# Patient Record
Sex: Female | Born: 1985 | Hispanic: No | Marital: Single | Smoking: Never smoker
Health system: Southern US, Community
[De-identification: ages and names within clinical notes are randomized; demographics above are authoritative.]

## PROBLEM LIST (undated history)

## (undated) DIAGNOSIS — G43909 Migraine, unspecified, not intractable, without status migrainosus: Secondary | ICD-10-CM

## (undated) HISTORY — DX: Migraine, unspecified, not intractable, without status migrainosus: G43.909

## (undated) HISTORY — PX: WISDOM TOOTH EXTRACTION: SHX21

## (undated) HISTORY — PX: COLPOSCOPY: SHX161

---

## 2008-03-26 ENCOUNTER — Other Ambulatory Visit: Admission: RE | Admit: 2008-03-26 | Discharge: 2008-03-26 | Payer: Self-pay | Admitting: Gynecology

## 2008-09-05 ENCOUNTER — Other Ambulatory Visit: Admission: RE | Admit: 2008-09-05 | Discharge: 2008-09-05 | Payer: Self-pay | Admitting: Gynecology

## 2011-12-24 LAB — OB RESULTS CONSOLE HIV ANTIBODY (ROUTINE TESTING): HIV: NONREACTIVE

## 2011-12-24 LAB — OB RESULTS CONSOLE GC/CHLAMYDIA
Chlamydia: NEGATIVE
Gonorrhea: NEGATIVE

## 2011-12-24 LAB — OB RESULTS CONSOLE ABO/RH: RH Type: POSITIVE

## 2011-12-24 LAB — OB RESULTS CONSOLE RUBELLA ANTIBODY, IGM: Rubella: UNDETERMINED

## 2011-12-24 LAB — OB RESULTS CONSOLE HEPATITIS B SURFACE ANTIGEN: Hepatitis B Surface Ag: NEGATIVE

## 2012-07-24 ENCOUNTER — Encounter (HOSPITAL_COMMUNITY): Payer: Self-pay | Admitting: Anesthesiology

## 2012-07-24 ENCOUNTER — Encounter (HOSPITAL_COMMUNITY): Payer: Self-pay | Admitting: *Deleted

## 2012-07-24 ENCOUNTER — Inpatient Hospital Stay (HOSPITAL_COMMUNITY)
Admission: AD | Admit: 2012-07-24 | Discharge: 2012-07-26 | DRG: 372 | Disposition: A | Discharge status: Home / Self Care | Payer: BC Managed Care – PPO | Source: Ambulatory Visit | Attending: Obstetrics & Gynecology | Admitting: Obstetrics & Gynecology

## 2012-07-24 ENCOUNTER — Inpatient Hospital Stay (HOSPITAL_COMMUNITY): Payer: BC Managed Care – PPO | Admitting: Anesthesiology

## 2012-07-24 DIAGNOSIS — O4290 Premature rupture of membranes, unspecified as to length of time between rupture and onset of labor, unspecified weeks of gestation: Principal | ICD-10-CM | POA: Diagnosis present

## 2012-07-24 LAB — CBC
MCHC: 35.2 g/dL (ref 30.0–36.0)
MCV: 88.2 fL (ref 78.0–100.0)
Platelets: 206 10*3/uL (ref 150–400)
RDW: 12.5 % (ref 11.5–15.5)
WBC: 13.8 10*3/uL — ABNORMAL HIGH (ref 4.0–10.5)

## 2012-07-24 MED ORDER — LIDOCAINE HCL (PF) 1 % IJ SOLN
INTRAMUSCULAR | Status: DC | PRN
  Administered 2012-07-24 (×4): 4 mL

## 2012-07-24 MED ORDER — EPHEDRINE 5 MG/ML INJ
10.0000 mg | INTRAVENOUS | Status: DC | PRN
  Filled 2012-07-24: qty 4

## 2012-07-24 MED ORDER — BENZOCAINE-MENTHOL 20-0.5 % EX AERO
1.0000 "application " | INHALATION_SPRAY | CUTANEOUS | Status: DC | PRN
  Administered 2012-07-24: 1 via TOPICAL
  Filled 2012-07-24: qty 56

## 2012-07-24 MED ORDER — PHENYLEPHRINE 40 MCG/ML (10ML) SYRINGE FOR IV PUSH (FOR BLOOD PRESSURE SUPPORT)
80.0000 ug | PREFILLED_SYRINGE | INTRAVENOUS | Status: DC | PRN
  Filled 2012-07-24: qty 5

## 2012-07-24 MED ORDER — IBUPROFEN 600 MG PO TABS
600.0000 mg | ORAL_TABLET | Freq: Four times a day (QID) | ORAL | Status: DC | PRN
  Administered 2012-07-24: 600 mg via ORAL
  Filled 2012-07-24: qty 1

## 2012-07-24 MED ORDER — SENNOSIDES-DOCUSATE SODIUM 8.6-50 MG PO TABS
2.0000 | ORAL_TABLET | Freq: Every day | ORAL | Status: DC
  Administered 2012-07-24 – 2012-07-25 (×2): 2 via ORAL

## 2012-07-24 MED ORDER — ONDANSETRON HCL 4 MG/2ML IJ SOLN: 4.0000 mg | Freq: Four times a day (QID) | INTRAMUSCULAR | Status: DC | PRN

## 2012-07-24 MED ORDER — EPHEDRINE 5 MG/ML INJ: 10.0000 mg | INTRAVENOUS | Status: DC | PRN

## 2012-07-24 MED ORDER — OXYTOCIN BOLUS FROM INFUSION: 500.0000 mL | INTRAVENOUS | Status: DC

## 2012-07-24 MED ORDER — LACTATED RINGERS IV SOLN
500.0000 mL | Freq: Once | INTRAVENOUS | Status: AC
  Administered 2012-07-24: 10:00:00 via INTRAVENOUS

## 2012-07-24 MED ORDER — SIMETHICONE 80 MG PO CHEW: 80.0000 mg | CHEWABLE_TABLET | ORAL | Status: DC | PRN

## 2012-07-24 MED ORDER — FLEET ENEMA 7-19 GM/118ML RE ENEM: 1.0000 | ENEMA | RECTAL | Status: DC | PRN

## 2012-07-24 MED ORDER — ACETAMINOPHEN 325 MG PO TABS: 650.0000 mg | ORAL_TABLET | ORAL | Status: DC | PRN

## 2012-07-24 MED ORDER — OXYTOCIN 40 UNITS IN LACTATED RINGERS INFUSION - SIMPLE MED
62.5000 mL/h | INTRAVENOUS | Status: DC
  Administered 2012-07-24: 62.5 mL/h via INTRAVENOUS
  Filled 2012-07-24: qty 1000

## 2012-07-24 MED ORDER — LIDOCAINE HCL (PF) 1 % IJ SOLN
30.0000 mL | INTRAMUSCULAR | Status: DC | PRN
  Filled 2012-07-24: qty 30

## 2012-07-24 MED ORDER — IBUPROFEN 600 MG PO TABS
600.0000 mg | ORAL_TABLET | Freq: Four times a day (QID) | ORAL | Status: DC
  Administered 2012-07-25 – 2012-07-26 (×7): 600 mg via ORAL
  Filled 2012-07-24 (×6): qty 1

## 2012-07-24 MED ORDER — DIBUCAINE 1 % RE OINT: 1.0000 "application " | TOPICAL_OINTMENT | RECTAL | Status: DC | PRN

## 2012-07-24 MED ORDER — FENTANYL 2.5 MCG/ML BUPIVACAINE 1/10 % EPIDURAL INFUSION (WH - ANES)
14.0000 mL/h | INTRAMUSCULAR | Status: DC
  Administered 2012-07-24: 14 mL/h via EPIDURAL
  Filled 2012-07-24: qty 125

## 2012-07-24 MED ORDER — DIPHENHYDRAMINE HCL 50 MG/ML IJ SOLN: 12.5000 mg | INTRAMUSCULAR | Status: DC | PRN

## 2012-07-24 MED ORDER — TETANUS-DIPHTH-ACELL PERTUSSIS 5-2.5-18.5 LF-MCG/0.5 IM SUSP: 0.5000 mL | Freq: Once | INTRAMUSCULAR | Status: DC

## 2012-07-24 MED ORDER — ZOLPIDEM TARTRATE 5 MG PO TABS: 5.0000 mg | ORAL_TABLET | Freq: Every evening | ORAL | Status: DC | PRN

## 2012-07-24 MED ORDER — ONDANSETRON HCL 4 MG/2ML IJ SOLN: 4.0000 mg | INTRAMUSCULAR | Status: DC | PRN

## 2012-07-24 MED ORDER — LANOLIN HYDROUS EX OINT: TOPICAL_OINTMENT | CUTANEOUS | Status: DC | PRN

## 2012-07-24 MED ORDER — TERBUTALINE SULFATE 1 MG/ML IJ SOLN: 0.2500 mg | Freq: Once | INTRAMUSCULAR | Status: DC | PRN

## 2012-07-24 MED ORDER — LACTATED RINGERS IV SOLN
INTRAVENOUS | Status: DC
  Administered 2012-07-24 (×2): via INTRAVENOUS

## 2012-07-24 MED ORDER — CITRIC ACID-SODIUM CITRATE 334-500 MG/5ML PO SOLN: 30.0000 mL | ORAL | Status: DC | PRN

## 2012-07-24 MED ORDER — PRENATAL MULTIVITAMIN CH
1.0000 | ORAL_TABLET | Freq: Every day | ORAL | Status: DC
  Administered 2012-07-24 – 2012-07-26 (×3): 1 via ORAL
  Filled 2012-07-24 (×3): qty 1

## 2012-07-24 MED ORDER — OXYCODONE-ACETAMINOPHEN 5-325 MG PO TABS: 1.0000 | ORAL_TABLET | ORAL | Status: DC | PRN

## 2012-07-24 MED ORDER — ONDANSETRON HCL 4 MG PO TABS: 4.0000 mg | ORAL_TABLET | ORAL | Status: DC | PRN

## 2012-07-24 MED ORDER — DIPHENHYDRAMINE HCL 25 MG PO CAPS: 25.0000 mg | ORAL_CAPSULE | Freq: Four times a day (QID) | ORAL | Status: DC | PRN

## 2012-07-24 MED ORDER — WITCH HAZEL-GLYCERIN EX PADS: 1.0000 "application " | MEDICATED_PAD | CUTANEOUS | Status: DC | PRN

## 2012-07-24 MED ORDER — OXYTOCIN 40 UNITS IN LACTATED RINGERS INFUSION - SIMPLE MED
1.0000 m[IU]/min | INTRAVENOUS | Status: DC
  Administered 2012-07-24: 2 m[IU]/min via INTRAVENOUS

## 2012-07-24 MED ORDER — LACTATED RINGERS IV SOLN
500.0000 mL | INTRAVENOUS | Status: DC | PRN
  Administered 2012-07-24 (×2): 500 mL via INTRAVENOUS

## 2012-07-24 MED ORDER — PHENYLEPHRINE 40 MCG/ML (10ML) SYRINGE FOR IV PUSH (FOR BLOOD PRESSURE SUPPORT): 80.0000 ug | PREFILLED_SYRINGE | INTRAVENOUS | Status: DC | PRN

## 2012-07-24 NOTE — H&P (Signed)
Alexandra Anderson is a 26 y.o. female presenting for PROM at 0215; rare mild CTX followed.  +FM.  No VB.   Maternal Medical History:  Reason for admission: Reason for admission: rupture of membranes.  Contractions: Onset was 3-5 hours ago.   Frequency: rare.   Perceived severity is mild.    Fetal activity: Perceived fetal activity is normal.   Last perceived fetal movement was within the past hour.    Prenatal complications: no prenatal complications Prenatal Complications - Diabetes: none.    OB History    Grav Para Term Preterm Abortions TAB SAB Ect Mult Living   1              No past medical history on file. Past Surgical History  Procedure Date  . Colposcopy    Family History: family history is negative for Other. Social History:  reports that she has never smoked. She does not have any smokeless tobacco history on file. She reports that she does not drink alcohol or use illicit drugs.   Prenatal Transfer Tool  Maternal Diabetes: No Genetic Screening: Normal Maternal Ultrasounds/Referrals: Normal Fetal Ultrasounds or other Referrals:  None Maternal Substance Abuse:  No Significant Maternal Medications:  None Significant Maternal Lab Results:  None Other Comments:  None  ROS  Dilation: 3 Effacement (%): 100 Station: -2 Exam by:: B Mosca Blood pressure 135/80, pulse 102, temperature 97.9 F (36.6 C), temperature source Oral, resp. rate 18, height 5\' 4"  (1.626 m), last menstrual period 10/16/2011. Maternal Exam:  Uterine Assessment: Contraction strength is mild.  Contraction frequency is rare.   Abdomen: Patient reports no abdominal tenderness. Fundal height is c/w dates.   Estimated fetal weight is 7#8.   Fetal presentation: vertex  Introitus: Normal vulva. Normal vagina.  Ferning test: positive.  Amniotic fluid character: clear.  Pelvis: adequate for delivery.   Cervix: Cervix evaluated by digital exam.     Physical Exam  Constitutional: She is oriented to  person, place, and time. She appears well-developed and well-nourished.  HENT:  Head: Normocephalic and atraumatic.  Neck: Normal range of motion. Neck supple.  GI: Soft. Bowel sounds are normal.  Neurological: She is alert and oriented to person, place, and time.  Skin: Skin is warm and dry.  Psychiatric: She has a normal mood and affect. Her behavior is normal.    Prenatal labs: ABO, Rh: B/Positive/-- (05/02 0000) Antibody: Negative (05/02 0000) Rubella: Equivocal (05/02 0000) RPR: Nonreactive (05/02 0000)  HBsAg: Negative (05/02 0000)  HIV: Non-reactive (05/02 0000)  GBS: Negative (10/30 0000)   Assessment/Plan: 26yo G1 at [redacted]w[redacted]d with PROM -Will augment with pitocin -GBS neg -Epidural when desired   Rosette Bellavance 07/24/2012, 7:13 AM

## 2012-07-24 NOTE — Anesthesia Procedure Notes (Signed)
Epidural Patient location during procedure: OB Start time: 07/24/2012 10:30 AM  Staffing Performed by: anesthesiologist   Preanesthetic Checklist Completed: patient identified, site marked, surgical consent, pre-op evaluation, timeout performed, IV checked, risks and benefits discussed and monitors and equipment checked  Epidural Patient position: sitting Prep: site prepped and draped and DuraPrep Patient monitoring: continuous pulse ox and blood pressure Approach: midline Injection technique: LOR air  Needle:  Needle type: Tuohy  Needle gauge: 17 G Needle length: 9 cm and 9 Needle insertion depth: 6 cm Catheter type: closed end flexible Catheter size: 19 Gauge Catheter at skin depth: 11 cm Test dose: negative  Assessment Events: blood not aspirated, injection not painful, no injection resistance, negative IV test and no paresthesia  Additional Notes Discussed risk of headache, infection, bleeding, nerve injury and failed or incomplete block.  Patient voices understanding and wishes to proceed. Reason for block:procedure for pain

## 2012-07-24 NOTE — Anesthesia Preprocedure Evaluation (Signed)

## 2012-07-24 NOTE — MAU Note (Signed)
Pt reports LOF  @ 0215. Pt also reports cramping.

## 2012-07-24 NOTE — Op Note (Signed)
SVD of VFI without immediate complication; wt and cord pH pending; APGARs 8, 9.  EBL 300cc. Head delivered LOA with atraumatic delivery of the body through a tight body cord.  Mouth and nose bulb suctioned.  Cord clamped, cut and baby to abdomen.  Cord pH obtained.  Placenta delivered S/I/3VC and sent to L&D.  Fundus firmed with pitocin and massage.  Bilateral periurethral lacs hemostatic without repair.  Mom and baby stable.    Mitchel Honour, DO

## 2012-07-24 NOTE — Progress Notes (Signed)
No complaints.  Comfortable with epidural. VSS.  Toco: unable to trace; palpate q 2-4 minutes SVE: 5/c/-2, AROM forebag with clear fluid; IUPC placed FHT: 130. Mod var. No accel. No decel. 26yo G1 at [redacted]w[redacted]d for augmentation -Will watch MVUs and adjust pitocin appropriately -Anticipate NSVD  Mitchel Honour, DO

## 2012-07-25 LAB — CBC
Platelets: 162 10*3/uL (ref 150–400)
RBC: 3.58 MIL/uL — ABNORMAL LOW (ref 3.87–5.11)
RDW: 12.7 % (ref 11.5–15.5)
WBC: 13.7 10*3/uL — ABNORMAL HIGH (ref 4.0–10.5)

## 2012-07-25 NOTE — Progress Notes (Cosign Needed)
Post Partum Day 1 Subjective: no complaints, up ad lib, voiding and tolerating PO  Objective: Blood pressure 120/71, pulse 85, temperature 97.8 F (36.6 C), temperature source Oral, resp. rate 20, height 5\' 4"  (1.626 m), weight 86.183 kg (190 lb), last menstrual period 10/16/2011, SpO2 100.00%, unknown if currently breastfeeding.  Physical Exam:  General: alert and cooperative Lochia: appropriate Uterine Fundus: firm Incision: perineum intact DVT Evaluation: No evidence of DVT seen on physical exam. Negative Homan's sign. No cords or calf tenderness. No significant calf/ankle edema.   Basename 07/25/12 0507 07/24/12 0715  HGB 11.0* 12.9  HCT 31.9* 36.6    Assessment/Plan: Plan for discharge tomorrow   LOS: 1 day   Alexandra Anderson 07/25/2012, 8:11 AM

## 2012-07-25 NOTE — Anesthesia Postprocedure Evaluation (Signed)
Anesthesia Post Note  Patient: Alexandra Anderson  Procedure(s) Performed: * No procedures listed *  Anesthesia type: Epidural  Patient location: Mother/Baby  Post pain: Pain level controlled  Post assessment: Post-op Vital signs reviewed  Last Vitals:  Filed Vitals:   07/25/12 0655  BP: 120/71  Pulse: 85  Temp: 36.6 C  Resp: 20    Post vital signs: Reviewed  Level of consciousness:alert  Complications: No apparent anesthesia complications

## 2012-07-26 MED ORDER — IBUPROFEN 600 MG PO TABS: 600.0000 mg | ORAL_TABLET | Freq: Four times a day (QID) | ORAL | Status: DC

## 2012-07-26 NOTE — Discharge Summary (Signed)
Obstetric Discharge Summary Reason for Admission: rupture of membranes Prenatal Procedures: ultrasound Intrapartum Procedures: spontaneous vaginal delivery Postpartum Procedures: none Complications-Operative and Postpartum: none Hemoglobin  Date Value Range Status  07/25/2012 11.0* 12.0 - 15.0 g/dL Final     HCT  Date Value Range Status  07/25/2012 31.9* 36.0 - 46.0 % Final    Physical Exam:  General: alert and cooperative Lochia: appropriate Uterine Fundus: firm Incision: perineum intact DVT Evaluation: No evidence of DVT seen on physical exam. No significant calf/ankle edema.  Discharge Diagnoses: Term Pregnancy-delivered  Discharge Information: Date: 07/26/2012 Activity: pelvic rest Diet: routine Medications: PNV and Ibuprofen Condition: stable Instructions: refer to practice specific booklet Discharge to: home   Newborn Data: Live born female  Birth Weight: 8 lb 6.2 oz (3805 g) APGAR: 8, 9  Home with mother.  Lainy Wrobleski G 07/26/2012, 8:00 AM

## 2012-07-27 ENCOUNTER — Inpatient Hospital Stay (HOSPITAL_COMMUNITY): Admission: RE | Admit: 2012-07-27 | Payer: 59 | Source: Ambulatory Visit

## 2012-09-15 ENCOUNTER — Emergency Department (HOSPITAL_COMMUNITY): Payer: BC Managed Care – PPO

## 2012-09-15 ENCOUNTER — Emergency Department (HOSPITAL_COMMUNITY)
Admission: EM | Admit: 2012-09-15 | Discharge: 2012-09-15 | Disposition: A | Discharge status: Home / Self Care | Payer: BC Managed Care – PPO | Attending: Emergency Medicine | Admitting: Emergency Medicine

## 2012-09-15 ENCOUNTER — Encounter (HOSPITAL_COMMUNITY): Payer: Self-pay | Admitting: Emergency Medicine

## 2012-09-15 DIAGNOSIS — S52509A Unspecified fracture of the lower end of unspecified radius, initial encounter for closed fracture: Secondary | ICD-10-CM | POA: Insufficient documentation

## 2012-09-15 DIAGNOSIS — W010XXA Fall on same level from slipping, tripping and stumbling without subsequent striking against object, initial encounter: Secondary | ICD-10-CM | POA: Insufficient documentation

## 2012-09-15 DIAGNOSIS — S52609A Unspecified fracture of lower end of unspecified ulna, initial encounter for closed fracture: Secondary | ICD-10-CM | POA: Insufficient documentation

## 2012-09-15 DIAGNOSIS — R42 Dizziness and giddiness: Secondary | ICD-10-CM | POA: Insufficient documentation

## 2012-09-15 DIAGNOSIS — R5381 Other malaise: Secondary | ICD-10-CM | POA: Insufficient documentation

## 2012-09-15 DIAGNOSIS — Y929 Unspecified place or not applicable: Secondary | ICD-10-CM | POA: Insufficient documentation

## 2012-09-15 DIAGNOSIS — R209 Unspecified disturbances of skin sensation: Secondary | ICD-10-CM | POA: Insufficient documentation

## 2012-09-15 DIAGNOSIS — W108XXA Fall (on) (from) other stairs and steps, initial encounter: Secondary | ICD-10-CM | POA: Insufficient documentation

## 2012-09-15 DIAGNOSIS — H53489 Generalized contraction of visual field, unspecified eye: Secondary | ICD-10-CM | POA: Insufficient documentation

## 2012-09-15 DIAGNOSIS — S0993XA Unspecified injury of face, initial encounter: Secondary | ICD-10-CM | POA: Insufficient documentation

## 2012-09-15 DIAGNOSIS — R002 Palpitations: Secondary | ICD-10-CM | POA: Insufficient documentation

## 2012-09-15 DIAGNOSIS — Y939 Activity, unspecified: Secondary | ICD-10-CM | POA: Insufficient documentation

## 2012-09-15 MED ORDER — OXYCODONE-ACETAMINOPHEN 5-325 MG PO TABS
1.0000 | ORAL_TABLET | Freq: Once | ORAL | Status: AC
  Administered 2012-09-15: 1 via ORAL
  Filled 2012-09-15: qty 1

## 2012-09-15 MED ORDER — OXYCODONE-ACETAMINOPHEN 5-325 MG PO TABS: 1.0000 | ORAL_TABLET | Freq: Four times a day (QID) | ORAL | Status: DC | PRN

## 2012-09-15 NOTE — ED Notes (Signed)
Pt complains of "I fell this morning on my arm" Pt complains of pain to right wrist and forearm at this time. Pt does report positive LOC and hitting head when she fell. Pt alert and oriented at this time.

## 2012-09-15 NOTE — ED Provider Notes (Signed)
History     CSN: [id number]  Arrival date & time 09/15/12  1009   First MD Initiated Contact with Patient 09/15/12 1022      Chief Complaint  Patient presents with  . Arm Pain    (Consider location/radiation/quality/duration/timing/severity/associated sxs/prior treatment) Patient is a 27 y.o. female presenting with arm pain.  Arm Pain   Patient presents to the emergency department complaining of right forearm and wrist pain after a fall on an outstretched hand. She was walking her dog and tripped over a stair and landed with all her weight on her right hand. She has swelling, tenderness to palpation, pain with movement. No paresthesias, no numbness.  Patient admits to hitting her head on the pavement, but she is unsure where on her head the contact was. Patient states that once she reached her second floor apartment, she began experiencing tunnel vision and lightheadedness. She was able to ambulate to the couch where she briefly syncopized for less than one minute. She has no remaining lightheadedness, dizziness, weakness, headache, or scalp pain.   History reviewed. No pertinent past medical history.  Past Surgical History  Procedure Date  . Colposcopy     Family History  Problem Relation Age of Onset  . Other Neg Hx   . Heart disease Father     History  Substance Use Topics  . Smoking status: Never Smoker   . Smokeless tobacco: Not on file  . Alcohol Use: No    OB History    Grav Para Term Preterm Abortions TAB SAB Ect Mult Living   1 1 1  0 0 0 0 0 0 1      Review of Systems All other systems negative except as documented in the HPI. All pertinent positives and negatives as reviewed in the HPI.   Allergies  Sulfa antibiotics  Home Medications  No current outpatient prescriptions on file.  BP 108/58  Pulse 62  Temp 98.5 F (36.9 C) (Oral)  Resp 18  SpO2 100%  Breastfeeding? No  Physical Exam  Constitutional: She is oriented to person, place, and  time. She appears well-developed and well-nourished.  HENT:  Head: Normocephalic and atraumatic.  Cardiovascular: Normal rate.   Pulmonary/Chest: Effort normal.  Musculoskeletal:       Right wrist: She exhibits decreased range of motion, tenderness, bony tenderness and swelling.       Right forearm: She exhibits tenderness, bony tenderness and swelling. She exhibits no laceration.       Arms: Neurological: She is alert and oriented to person, place, and time. No cranial nerve deficit or sensory deficit. GCS eye subscore is 4. GCS verbal subscore is 5. GCS motor subscore is 6.       No sensory deficits. Able to move all five fingers on right hand, but unable to flex or extend wrist due to pain.   Skin: Skin is warm and dry. Abrasion noted. No laceration and no rash noted. There is erythema. No pallor.     Psychiatric: She has a normal mood and affect. Her behavior is normal. Judgment and thought content normal.    ED Course  Procedures (including critical care time)  Labs Reviewed - No data to display Dg Forearm Right  09/15/2012  *RADIOLOGY REPORT*  Clinical Data: Right wrist and forearm pain post fall  RIGHT FOREARM - 2 VIEW  Comparison: None  Findings: Metaphyseal fracture distal right radius with minimal dorsal displacement and apex volar angulation. Minimally displaced ulnar styloid fracture. No additional  fracture, dislocation or bone destruction.  IMPRESSION: Ulnar styloid fracture. Transverse displaced and angulated distal right radial metaphyseal fracture.   Original Report Authenticated By: Ulyses Southward, M.D.    Dg Wrist Complete Right  09/15/2012  *RADIOLOGY REPORT*  Clinical Data: Fall today landing on wrist, pain right wrist and forearm  RIGHT WRIST - COMPLETE 3+ VIEW  Comparison: None.  Findings: Displaced ulnar styloid fracture. Transverse comminuted metaphyseal fracture distal right radius with mild dorsal displacement and apex volar angulation. No definite intra-articular  extension. Joint spaces preserved. No additional fracture or dislocation identified.  IMPRESSION: Displaced ulnar styloid fracture. Comminuted displaced and angulated distal right radial metaphyseal fracture as above.   Original Report Authenticated By: Ulyses Southward, M.D.      Spoke with Dr. Merlyn Lot about the patient and he will see the patient in the office. Patient voices an understanding.  MDM  MDM Reviewed: vitals and nursing note Interpretation: x-ray             Carlyle Dolly, PA-C 09/16/12 1529

## 2012-09-16 ENCOUNTER — Other Ambulatory Visit: Payer: Self-pay | Admitting: Orthopedic Surgery

## 2012-09-16 ENCOUNTER — Encounter (HOSPITAL_BASED_OUTPATIENT_CLINIC_OR_DEPARTMENT_OTHER): Payer: Self-pay | Admitting: *Deleted

## 2012-09-17 NOTE — ED Provider Notes (Signed)
Medical screening examination/treatment/procedure(s) were performed by non-physician practitioner and as supervising physician I was immediately available for consultation/collaboration.   Winnona Wargo, MD 09/17/12 1419 

## 2012-09-19 ENCOUNTER — Ambulatory Visit (HOSPITAL_BASED_OUTPATIENT_CLINIC_OR_DEPARTMENT_OTHER)
Admission: RE | Admit: 2012-09-19 | Discharge: 2012-09-19 | Disposition: A | Discharge status: Home / Self Care | Payer: BC Managed Care – PPO | Source: Ambulatory Visit | Attending: Orthopedic Surgery | Admitting: Orthopedic Surgery

## 2012-09-19 ENCOUNTER — Ambulatory Visit (HOSPITAL_BASED_OUTPATIENT_CLINIC_OR_DEPARTMENT_OTHER): Payer: BC Managed Care – PPO | Admitting: Anesthesiology

## 2012-09-19 ENCOUNTER — Encounter (HOSPITAL_BASED_OUTPATIENT_CLINIC_OR_DEPARTMENT_OTHER): Payer: Self-pay | Admitting: Orthopedic Surgery

## 2012-09-19 ENCOUNTER — Encounter (HOSPITAL_BASED_OUTPATIENT_CLINIC_OR_DEPARTMENT_OTHER): Payer: Self-pay | Admitting: *Deleted

## 2012-09-19 ENCOUNTER — Encounter (HOSPITAL_BASED_OUTPATIENT_CLINIC_OR_DEPARTMENT_OTHER): Payer: Self-pay | Admitting: Anesthesiology

## 2012-09-19 ENCOUNTER — Encounter (HOSPITAL_BASED_OUTPATIENT_CLINIC_OR_DEPARTMENT_OTHER)
Admission: RE | Disposition: A | Discharge status: Home / Self Care | Payer: Self-pay | Source: Ambulatory Visit | Attending: Orthopedic Surgery

## 2012-09-19 DIAGNOSIS — Z882 Allergy status to sulfonamides status: Secondary | ICD-10-CM | POA: Insufficient documentation

## 2012-09-19 DIAGNOSIS — S52599A Other fractures of lower end of unspecified radius, initial encounter for closed fracture: Secondary | ICD-10-CM | POA: Insufficient documentation

## 2012-09-19 DIAGNOSIS — W19XXXA Unspecified fall, initial encounter: Secondary | ICD-10-CM | POA: Insufficient documentation

## 2012-09-19 HISTORY — PX: OPEN REDUCTION INTERNAL FIXATION (ORIF) DISTAL RADIAL FRACTURE: SHX5989

## 2012-09-19 SURGERY — OPEN REDUCTION INTERNAL FIXATION (ORIF) DISTAL RADIUS FRACTURE
Anesthesia: Choice | Site: Wrist | Laterality: Right | Wound class: Clean

## 2012-09-19 MED ORDER — PROPOFOL 10 MG/ML IV BOLUS
INTRAVENOUS | Status: DC | PRN
  Administered 2012-09-19: 150 mg via INTRAVENOUS

## 2012-09-19 MED ORDER — 0.9 % SODIUM CHLORIDE (POUR BTL) OPTIME
TOPICAL | Status: DC | PRN
  Administered 2012-09-19: 300 mL

## 2012-09-19 MED ORDER — LACTATED RINGERS IV SOLN
INTRAVENOUS | Status: DC
  Administered 2012-09-19: 12:00:00 via INTRAVENOUS

## 2012-09-19 MED ORDER — BUPIVACAINE-EPINEPHRINE PF 0.5-1:200000 % IJ SOLN
INTRAMUSCULAR | Status: DC | PRN
  Administered 2012-09-19: 30 mL

## 2012-09-19 MED ORDER — ONDANSETRON HCL 4 MG/2ML IJ SOLN
INTRAMUSCULAR | Status: DC | PRN
  Administered 2012-09-19: 4 mg via INTRAVENOUS

## 2012-09-19 MED ORDER — OXYCODONE HCL 5 MG PO TABS: 5.0000 mg | ORAL_TABLET | Freq: Once | ORAL | Status: DC | PRN

## 2012-09-19 MED ORDER — MIDAZOLAM HCL 2 MG/2ML IJ SOLN
1.0000 mg | INTRAMUSCULAR | Status: DC | PRN
  Administered 2012-09-19: 2 mg via INTRAVENOUS

## 2012-09-19 MED ORDER — LIDOCAINE HCL (CARDIAC) 20 MG/ML IV SOLN
INTRAVENOUS | Status: DC | PRN
  Administered 2012-09-19: 40 mg via INTRAVENOUS

## 2012-09-19 MED ORDER — ROPIVACAINE HCL 5 MG/ML IJ SOLN
INTRAMUSCULAR | Status: DC | PRN
  Administered 2012-09-19: 5 mL via EPIDURAL

## 2012-09-19 MED ORDER — FENTANYL CITRATE 0.05 MG/ML IJ SOLN
50.0000 ug | INTRAMUSCULAR | Status: DC | PRN
  Administered 2012-09-19: 100 ug via INTRAVENOUS

## 2012-09-19 MED ORDER — HYDROMORPHONE HCL PF 1 MG/ML IJ SOLN: 0.2500 mg | INTRAMUSCULAR | Status: DC | PRN

## 2012-09-19 MED ORDER — OXYCODONE HCL 5 MG/5ML PO SOLN: 5.0000 mg | Freq: Once | ORAL | Status: DC | PRN

## 2012-09-19 MED ORDER — DEXAMETHASONE SODIUM PHOSPHATE 4 MG/ML IJ SOLN
INTRAMUSCULAR | Status: DC | PRN
  Administered 2012-09-19: 10 mg via INTRAVENOUS

## 2012-09-19 MED ORDER — CEFAZOLIN SODIUM-DEXTROSE 2-3 GM-% IV SOLR
2.0000 g | Freq: Once | INTRAVENOUS | Status: AC
  Administered 2012-09-19: 2 g via INTRAVENOUS

## 2012-09-19 MED ORDER — HYDROCODONE-ACETAMINOPHEN 5-325 MG PO TABS: ORAL_TABLET | ORAL | Status: DC

## 2012-09-19 MED ORDER — CHLORHEXIDINE GLUCONATE 4 % EX LIQD: 60.0000 mL | Freq: Once | CUTANEOUS | Status: DC

## 2012-09-19 SURGICAL SUPPLY — 73 items
BANDAGE CONFORM 3  STR LF (GAUZE/BANDAGES/DRESSINGS) IMPLANT
BANDAGE ELASTIC 3 VELCRO ST LF (GAUZE/BANDAGES/DRESSINGS) IMPLANT
BANDAGE GAUZE ELAST BULKY 4 IN (GAUZE/BANDAGES/DRESSINGS) ×2 IMPLANT
BIT DRILL 2.0 LNG QUCK RELEASE (BIT) ×1 IMPLANT
BIT DRILL 2.8X5 QR DISP (BIT) ×2 IMPLANT
BLADE MINI RND TIP GREEN BEAV (BLADE) IMPLANT
BLADE SURG 15 STRL LF DISP TIS (BLADE) ×2 IMPLANT
BLADE SURG 15 STRL SS (BLADE) ×2
BNDG ELASTIC 2 VLCR STRL LF (GAUZE/BANDAGES/DRESSINGS) IMPLANT
BNDG ESMARK 4X9 LF (GAUZE/BANDAGES/DRESSINGS) ×2 IMPLANT
CHLORAPREP W/TINT 26ML (MISCELLANEOUS) ×2 IMPLANT
CLOTH BEACON ORANGE TIMEOUT ST (SAFETY) ×2 IMPLANT
CORDS BIPOLAR (ELECTRODE) ×2 IMPLANT
COVER MAYO STAND STRL (DRAPES) ×2 IMPLANT
COVER TABLE BACK 60X90 (DRAPES) ×2 IMPLANT
DRAPE EXTREMITY T 121X128X90 (DRAPE) ×2 IMPLANT
DRAPE OEC MINIVIEW 54X84 (DRAPES) ×2 IMPLANT
DRAPE SURG 17X23 STRL (DRAPES) ×2 IMPLANT
DRAPE UTILITY W/TAPE 26X15 (DRAPES) ×2 IMPLANT
DRILL 2.0 LNG QUICK RELEASE (BIT) ×2
GAUZE XEROFORM 1X8 LF (GAUZE/BANDAGES/DRESSINGS) ×2 IMPLANT
GLOVE BIO SURGEON STRL SZ7.5 (GLOVE) ×2 IMPLANT
GLOVE BIOGEL M STRL SZ7.5 (GLOVE) ×2 IMPLANT
GLOVE BIOGEL PI IND STRL 8 (GLOVE) ×2 IMPLANT
GLOVE BIOGEL PI IND STRL 8.5 (GLOVE) IMPLANT
GLOVE BIOGEL PI INDICATOR 8 (GLOVE) ×2
GLOVE BIOGEL PI INDICATOR 8.5 (GLOVE)
GLOVE SKINSENSE NS SZ7.0 (GLOVE) ×1
GLOVE SKINSENSE STRL SZ7.0 (GLOVE) ×1 IMPLANT
GLOVE SURG ORTHO 8.0 STRL STRW (GLOVE) IMPLANT
GOWN PREVENTION PLUS XLARGE (GOWN DISPOSABLE) ×4 IMPLANT
GOWN STRL REIN XL XLG (GOWN DISPOSABLE) ×2 IMPLANT
GUIDEWIRE ORTHO 0.054X6 (WIRE) ×6 IMPLANT
NEEDLE HYPO 22GX1.5 SAFETY (NEEDLE) IMPLANT
NEEDLE HYPO 25X1 1.5 SAFETY (NEEDLE) IMPLANT
NS IRRIG 1000ML POUR BTL (IV SOLUTION) ×2 IMPLANT
PACK BASIN DAY SURGERY FS (CUSTOM PROCEDURE TRAY) ×2 IMPLANT
PAD CAST 3X4 CTTN HI CHSV (CAST SUPPLIES) ×1 IMPLANT
PAD CAST 4YDX4 CTTN HI CHSV (CAST SUPPLIES) IMPLANT
PADDING CAST ABS 4INX4YD NS (CAST SUPPLIES) ×1
PADDING CAST ABS COTTON 4X4 ST (CAST SUPPLIES) ×1 IMPLANT
PADDING CAST COTTON 3X4 STRL (CAST SUPPLIES) ×1
PADDING CAST COTTON 4X4 STRL (CAST SUPPLIES)
PLATE ACULOCK 2 NARROW RT (Plate) ×2 IMPLANT
SCREW 3.5MMX10.0MM (Screw) ×2 IMPLANT
SCREW 3.5MMX12.0MM (Screw) ×2 IMPLANT
SCREW CORT 3.5X14 (Screw) ×2 IMPLANT
SCREW CORT FT 20X2.3XLCK HEX (Screw) ×1 IMPLANT
SCREW CORT FT 22X2.3XLCK HEX (Screw) ×1 IMPLANT
SCREW CORTICAL LOCKING 2.3X18M (Screw) ×2 IMPLANT
SCREW CORTICAL LOCKING 2.3X20M (Screw) ×3 IMPLANT
SCREW CORTICAL LOCKING 2.3X22M (Screw) ×1 IMPLANT
SCREW FX18X2.3XSMTH LCK NS CRT (Screw) ×2 IMPLANT
SCREW FX20X2.3XSMTH LCK NS CRT (Screw) ×2 IMPLANT
SCREW NON TOGG 2.3X18MM (Screw) ×2 IMPLANT
SLEEVE SCD COMPRESS KNEE MED (MISCELLANEOUS) ×2 IMPLANT
SLING ARM FOAM STRAP MED (SOFTGOODS) ×2 IMPLANT
SPLINT PLASTER CAST XFAST 4X15 (CAST SUPPLIES) ×10 IMPLANT
SPLINT PLASTER XTRA FAST SET 4 (CAST SUPPLIES) ×10
SPONGE GAUZE 4X4 12PLY (GAUZE/BANDAGES/DRESSINGS) ×2 IMPLANT
STOCKINETTE 4X48 STRL (DRAPES) ×2 IMPLANT
SUCTION FRAZIER TIP 10 FR DISP (SUCTIONS) IMPLANT
SUT ETHILON 3 0 PS 1 (SUTURE) IMPLANT
SUT ETHILON 4 0 PS 2 18 (SUTURE) ×4 IMPLANT
SUT VIC AB 3-0 PS1 18 (SUTURE)
SUT VIC AB 3-0 PS1 18XBRD (SUTURE) IMPLANT
SUT VICRYL 4-0 PS2 18IN ABS (SUTURE) ×2 IMPLANT
SYR BULB 3OZ (MISCELLANEOUS) ×2 IMPLANT
SYR CONTROL 10ML LL (SYRINGE) IMPLANT
TOWEL OR 17X24 6PK STRL BLUE (TOWEL DISPOSABLE) ×2 IMPLANT
TUBE CONNECTING 20X1/4 (TUBING) IMPLANT
UNDERPAD 30X30 INCONTINENT (UNDERPADS AND DIAPERS) ×2 IMPLANT
WATER STERILE IRR 1000ML POUR (IV SOLUTION) IMPLANT

## 2012-09-19 NOTE — Brief Op Note (Signed)
09/19/2012  2:01 PM  PATIENT:  Alexandra Anderson  27 y.o. female  PRE-OPERATIVE DIAGNOSIS:  Right distal radius Fracture  POST-OPERATIVE DIAGNOSIS:  Right distal radius Fracture  PROCEDURE:  Procedure(s) (LRB) with comments: OPEN REDUCTION INTERNAL FIXATION (ORIF) DISTAL RADIAL FRACTURE (Right) - open reduction internal fixation right distal radius fracture   SURGEON:  Surgeon(s) and Role:    * Tami Ribas, MD - Primary  PHYSICIAN ASSISTANT:   ASSISTANTS: Annye Rusk, PA   ANESTHESIA:   regional and general  EBL:  Total I/O In: 1600 [I.V.:1600] Out: -   BLOOD ADMINISTERED:none  DRAINS: none   LOCAL MEDICATIONS USED:  NONE  SPECIMEN:  No Specimen  DISPOSITION OF SPECIMEN:  N/A  COUNTS:  YES  TOURNIQUET:   Total Tourniquet Time Documented: Upper Arm (Right) - 51 minutes  DICTATION: .Other Dictation: Dictation Number [id number]  PLAN OF CARE: Discharge to home after PACU  PATIENT DISPOSITION:  PACU - hemodynamically stable.

## 2012-09-19 NOTE — Anesthesia Postprocedure Evaluation (Signed)
  Anesthesia Post-op Note  Patient: Alexandra Anderson  Procedure(s) Performed: Procedure(s) (LRB) with comments: OPEN REDUCTION INTERNAL FIXATION (ORIF) DISTAL RADIAL FRACTURE (Right) - open reduction internal fixation right distal radius fracture   Patient Location: PACU  Anesthesia Type:GA combined with regional for post-op pain  Level of Consciousness: awake and alert   Airway and Oxygen Therapy: Patient Spontanous Breathing and Patient connected to face mask oxygen  Post-op Pain: none  Post-op Assessment: Post-op Vital signs reviewed, Patient's Cardiovascular Status Stable, Respiratory Function Stable, Patent Airway and No signs of Nausea or vomiting  Post-op Vital Signs: Reviewed and stable  Complications: No apparent anesthesia complications

## 2012-09-19 NOTE — Transfer of Care (Signed)
Immediate Anesthesia Transfer of Care Note  Patient: Alexandra Anderson  Procedure(s) Performed: Procedure(s) (LRB) with comments: OPEN REDUCTION INTERNAL FIXATION (ORIF) DISTAL RADIAL FRACTURE (Right) - open reduction internal fixation right distal radius fracture   Patient Location: PACU  Anesthesia Type:General  Level of Consciousness: awake, alert  and oriented  Airway & Oxygen Therapy: Patient Spontanous Breathing and Patient connected to face mask oxygen  Post-op Assessment: Report given to PACU RN and Post -op Vital signs reviewed and stable  Post vital signs: Reviewed and stable  Complications: No apparent anesthesia complications

## 2012-09-19 NOTE — Progress Notes (Signed)
Assisted Dr. Fitzgerald with right, ultrasound guided, interscalene  block. Side rails up, monitors on throughout procedure. See vital signs in flow sheet. Tolerated Procedure well. 

## 2012-09-19 NOTE — Op Note (Signed)
[id number] 

## 2012-09-19 NOTE — Anesthesia Procedure Notes (Addendum)
Anesthesia Regional Block:  Supraclavicular block  Pre-Anesthetic Checklist: ,, timeout performed, Correct Patient, Correct Site, Correct Laterality, Correct Procedure, Correct Position, site marked, Risks and benefits discussed, pre-op evaluation, post-op pain management  Laterality: Right  Prep: Maximum Sterile Barrier Precautions used and chloraprep       Needles:  Injection technique: Single-shot  Needle Type: Echogenic Stimulator Needle     Needle Length: 5cm 5 cm Needle Gauge: 22 and 22 G    Additional Needles:  Procedures: ultrasound guided (picture in chart) Supraclavicular block Narrative:  Start time: 09/19/2012 12:30 PM End time: 09/19/2012 12:42 PM Injection made incrementally with aspirations every 5 mL. Anesthesiologist: Fitzgerald,MD  Additional Notes: 2% Lidocaine skin wheel. Intercostobrachial block with 5cc of 0.5% Ropivicaine plain.  Supraclavicular block Procedure Name: LMA Insertion Performed by: York Grice Pre-anesthesia Checklist: Patient identified, Timeout performed, Emergency Drugs available, Suction available and Patient being monitored Patient Re-evaluated:Patient Re-evaluated prior to inductionOxygen Delivery Method: Circle system utilized Preoxygenation: Pre-oxygenation with 100% oxygen Intubation Type: IV induction Ventilation: Mask ventilation without difficulty LMA: LMA inserted LMA Size: 4.0 Tube type: Oral Number of attempts: 1 Placement Confirmation: breath sounds checked- equal and bilateral and positive ETCO2 Dental Injury: Teeth and Oropharynx as per pre-operative assessment

## 2012-09-19 NOTE — Anesthesia Preprocedure Evaluation (Signed)
Anesthesia Evaluation  Patient identified by MRN, date of birth, ID band Patient awake    Reviewed: Allergy & Precautions, H&P , NPO status , Patient's Chart, lab work & pertinent test results  Airway Mallampati: II TM Distance: >3 FB Neck ROM: Full    Dental No notable dental hx. (+) Teeth Intact and Dental Advisory Given   Pulmonary neg pulmonary ROS,  breath sounds clear to auscultation  Pulmonary exam normal       Cardiovascular negative cardio ROS  Rhythm:Regular Rate:Normal     Neuro/Psych negative neurological ROS  negative psych ROS   GI/Hepatic negative GI ROS, Neg liver ROS,   Endo/Other  negative endocrine ROS  Renal/GU negative Renal ROS  negative genitourinary   Musculoskeletal   Abdominal   Peds  Hematology negative hematology ROS (+)   Anesthesia Other Findings   Reproductive/Obstetrics negative OB ROS                           Anesthesia Physical Anesthesia Plan  ASA: I  Anesthesia Plan: General and Regional   Post-op Pain Management:    Induction: Intravenous  Airway Management Planned: LMA  Additional Equipment:   Intra-op Plan:   Post-operative Plan: Extubation in OR  Informed Consent: I have reviewed the patients History and Physical, chart, labs and discussed the procedure including the risks, benefits and alternatives for the proposed anesthesia with the patient or authorized representative who has indicated his/her understanding and acceptance.   Dental advisory given  Plan Discussed with: CRNA  Anesthesia Plan Comments:         Anesthesia Quick Evaluation  

## 2012-09-19 NOTE — H&P (Signed)
  Alexandra Anderson is an 27 y.o. female.   Chief Complaint: right distal radius fracture HPI: 28 yo rhd female states she fell onto steps 09/15/12 injuring right wrist.  Seen at [facility] where XR revealed right distal radius fracture.  Splinted and followed up in office.  Reports no previous injury to right wrist.  History reviewed. No pertinent past medical history.  Past Surgical History  Procedure Date  . Colposcopy   . Wisdom tooth extraction     Family History  Problem Relation Age of Onset  . Other Neg Hx   . Heart disease Father    Social History:  reports that she has never smoked. She does not have any smokeless tobacco history on file. She reports that she drinks alcohol. She reports that she does not use illicit drugs.  Allergies:  Allergies  Allergen Reactions  . Sulfa Antibiotics Rash    Medications Prior to Admission  Medication Sig Dispense Refill  . HYDROcodone-acetaminophen (NORCO/VICODIN) 5-325 MG per tablet Take 1 tablet by mouth every 6 (six) hours as needed.      Marland Kitchen oxyCODONE-acetaminophen (PERCOCET/ROXICET) 5-325 MG per tablet Take 1 tablet by mouth every 6 (six) hours as needed for pain.  20 tablet  0    Results for orders placed during the hospital encounter of 09/19/12 (from the past 48 hour(s))  POCT HEMOGLOBIN-HEMACUE     Status: Normal   Collection Time   09/19/12 11:49 AM      Component Value Range Comment   Hemoglobin 12.7  12.0 - 15.0 g/dL     No results found.   A comprehensive review of systems was negative except for: Eyes: positive for contacts/glasses  Blood pressure 113/66, pulse 58, temperature 97.7 F (36.5 C), temperature source Oral, resp. rate 17, height 5\' 4"  (1.626 m), weight 77.111 kg (170 lb), last menstrual period 08/17/2012, SpO2 99.00%, not currently breastfeeding.  General appearance: alert, cooperative and appears stated age Head: Normocephalic, without obvious abnormality, atraumatic Neck: supple, symmetrical, trachea  midline Resp: clear to auscultation bilaterally Cardio: regular rate and rhythm GI: non tender Extremities: intact sensation and capillary refill all digits.  +epl/fpl/io.  no wounds Pulses: 2+ and symmetric Skin: Skin color, texture, turgor normal. No rashes or lesions Neurologic: Grossly normal Incision/Wound: na  Assessment/Plan Right distal radius fracture.  Non operative and operative treatment options were discussed with the patient and patient wishes to proceed with operative treatment. Risks, benefits, and alternatives of surgery were discussed and the patient agrees with the plan of care.   Ricard Faulkner R 09/19/2012, 12:37 PM

## 2012-09-20 ENCOUNTER — Encounter (HOSPITAL_BASED_OUTPATIENT_CLINIC_OR_DEPARTMENT_OTHER): Payer: Self-pay | Admitting: Orthopedic Surgery

## 2012-09-20 NOTE — Op Note (Signed)
NAME:  Alexandra Anderson, Alexandra Anderson NO.:  [id number]  MEDICAL RECORD NO.:  [id number]  LOCATION:                                 FACILITY:  PHYSICIAN:  Betha Loa, MD        DATE OF BIRTH:  01-11-1986  DATE OF PROCEDURE:  09/19/2012 DATE OF DISCHARGE:                              OPERATIVE REPORT   PREOPERATIVE DIAGNOSIS:  Right distal radius fracture.  POSTOPERATIVE DIAGNOSIS:  Right distal radius fracture.  PROCEDURE:  Open reduction and internal fixation, right distal radius fracture.  SURGEON:  Betha Loa, MD  ASSISTANT:  Jonni Sanger, PA  ANESTHESIA:  General with regional.  IV FLUIDS:  Per anesthesia flow sheet.  ESTIMATED BLOOD LOSS:  Minimal.  COMPLICATIONS:  None.  SPECIMENS:  None.  TOURNIQUET TIME:  50 minutes.  DISPOSITION:  Stable to PACU.  INDICATIONS:  Alexandra Anderson is a 27 year old right-hand-dominant female who last week fell onto her right outstretched hand down some stairs.  She had pain in the wrist.  She presented to [facility] Emergency Department where radiographs were taken revealing right distal radius fracture.  She was placed in a splint and followed up with me in the office.  On evaluation, she had intact sensation and capillary refill in the fingertips.  She can flex and extend the IP joint and thumb and cross her fingers.  Radiographs revealed an extra-articular distal radius fracture.  I discussed with Alexandra Anderson the nature of the injury.  We discussed nonoperative and operative treatment options.  She wished to proceed with operative fixation.  Risks, benefits, and alternatives of surgery were discussed including the risk of blood loss, infection, damage to nerves, vessels, tendons, ligaments, bone; failure of surgery; need for additional surgery, complications with wound healing, continued pain, nonunion, malunion, stiffness.  She voiced understanding of these risks and elected to proceed.  OPERATIVE COURSE:  After  being identified preoperatively by myself, the patient and I agreed upon procedure and site of procedure.  Surgical site was marked.  Risks, benefits, and alternatives of surgery were reviewed and she wished to proceed.  Surgical consent was signed.  She was given 2 g of IV Ancef as preoperative antibiotic prophylaxis. Regional block was performed by anesthesia in preoperative holding.  She was transferred to  the operating room, placed on the operating room table in supine position with the right upper extremity in arm board. General anesthesia was induced by the anesthesiologist.  Right upper extremity was prepped and draped in normal sterile orthopedic fashion. A surgical pause was performed between surgeons, anesthesia, operating staff, and all were in agreement as to the patient, procedure, and site of procedure.  Tourniquet at the proximal aspect of the extremity was inflated to 250 mmHg after exsanguination of the limb with Esmarch bandage.  Standard volar Sherilyn Cooter approach was used.  Superficial and deep portions of the FCR tendon sheath were incised and the FCR and FPL swept ulnarly to protect the palmar cutaneous branch of the median nerve. Bipolar electrocautery was used to obtain hemostasis.  The brachioradialis was released.  The pronator quadratus was released from the radial side of the radius and  elevated with a periosteal elevator. Fracture site was identified.  It was cleared of soft tissue interposition.  It was reduced under direct visualization.  A narrow volar distal radial locking plate from the Acumed set was selected and secured to the bone using the guide pins.  The C-arm was used in AP and lateral projections to ensure appropriate reduction and position of hardware which was the case.  A single screw was placed in the slotted hole in the shaft of the plate.  The distal holes were filled, starting with the distal ulnar hole which was filled with a nonlocking screw to  bring the bone up to the plate.  The remaining holes were filled with locking pegs with the exception of the radial styloid holes which were filled with locking screws.  The nonlocking screw was then exchanged for locking peg.  The remaining 2 holes in the shaft and plate were filled with nonlocking screws.  Standard AO drilling and measuring technique was used throughout the case.  C-arm was used in AP, lateral, and oblique projections to ensure appropriate reduction and position of hardware which was the case.  There was no intra-articular penetration.  The wound was copiously irrigated with sterile saline.  Pronator quadratus was repaired back over top of the plate using 4-0 Vicryl suture.  A single inverted Rapide Vicryl stitch was placed in the subcutaneous tissues and skin was closed with 4-0 nylon in a horizontal mattress fashion.  The wound was dressed with sterile Xeroform, 4x4s, and wrapped with a Kerlix bandage.  A volar splint was placed and wrapped with Kerlix and Ace bandage.  Prior to placing the dressing, the forearm was placed through full pronation and supination with the distal radioulnar joint stable throughout.  The tourniquet was deflated at 50 minutes. Fingertips were pink with brisk capillary refill after deflation of tourniquet.  Preoperative drapes were broken down.  The patient was awoken from anesthesia safely.  She was transferred back to stretcher and taken to PACU in stable condition.  I will see her back in the office in 1 week for postoperative followup.  I will give her Norco 5/325, 1-2 p.o. q.6 hours p.r.n. pain, dispensed #40.     Betha Loa, MD     KK/MEDQ  D:  09/19/2012  T:  09/20/2012  Job:  [id number]

## 2013-08-03 IMAGING — CR DG FOREARM 2V*R*
2 series · 2 of 2 positions shown · non-contrast
Comparison: None

CLINICAL DATA: Right wrist and forearm pain post fall

RIGHT FOREARM - 2 VIEW

[x forearm lat right]
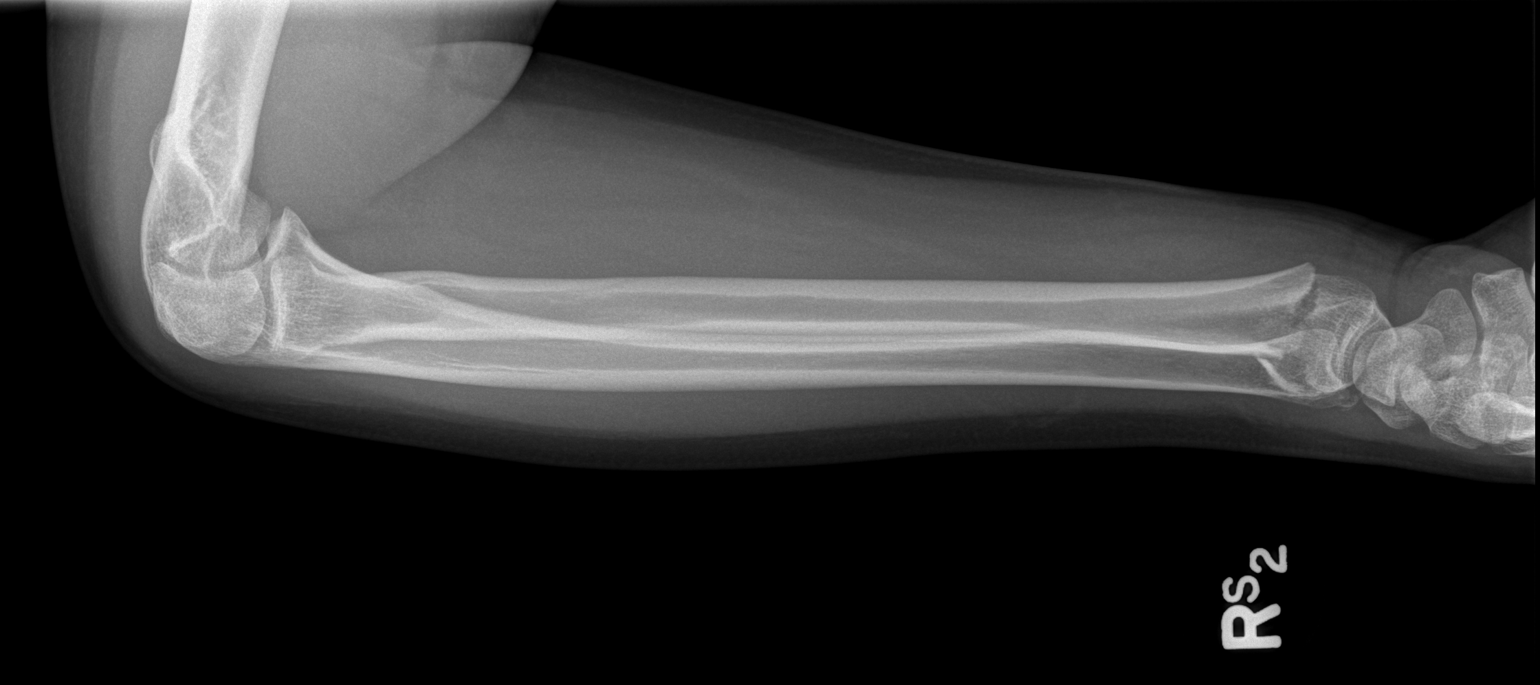

[x forearm ap right]
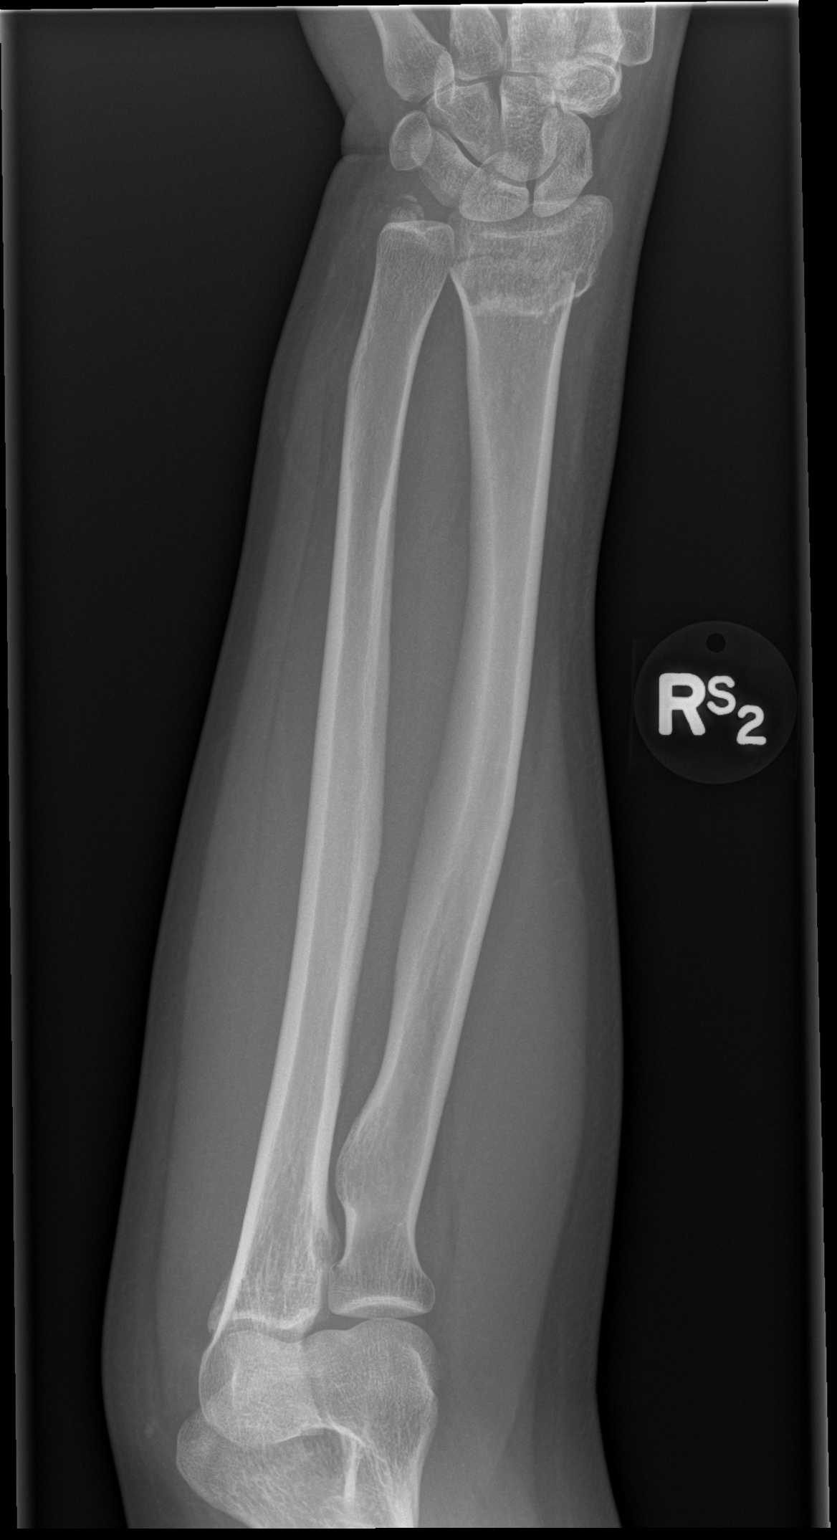

[2 of 2 positions shown; findings below may reference images not displayed]

FINDINGS: Metaphyseal fracture distal right radius with minimal dorsal
displacement and apex volar angulation.
Minimally displaced ulnar styloid fracture.
No additional fracture, dislocation or bone destruction.
IMPRESSION: Ulnar styloid fracture.
Transverse displaced and angulated distal right radial metaphyseal
fracture.

## 2014-06-20 ENCOUNTER — Encounter: Payer: Self-pay | Admitting: Family

## 2014-06-20 ENCOUNTER — Ambulatory Visit (INDEPENDENT_AMBULATORY_CARE_PROVIDER_SITE_OTHER): Payer: PRIVATE HEALTH INSURANCE | Admitting: Family

## 2014-06-20 VITALS — BP 128/82 | HR 70 | Temp 98.4°F | Resp 18 | Ht 64.0 in | Wt 180.1 lb

## 2014-06-20 DIAGNOSIS — E663 Overweight: Secondary | ICD-10-CM | POA: Insufficient documentation

## 2014-06-20 DIAGNOSIS — Z0000 Encounter for general adult medical examination without abnormal findings: Secondary | ICD-10-CM

## 2014-06-20 DIAGNOSIS — Z23 Encounter for immunization: Secondary | ICD-10-CM

## 2014-06-20 DIAGNOSIS — E669 Obesity, unspecified: Secondary | ICD-10-CM

## 2014-06-20 NOTE — Progress Notes (Signed)
Subjective:    Patient ID: Alexandra BerkshireLaura J Springer, female    DOB: 22-Jul-1986, 28 y.o.   MRN: [id number]  Chief Complaint  Patient presents with  . Wellness visit    For her job   HPI:  Alexandra Anderson is a 28 y.o. female who presents today for an annual wellness visit.  1) Health Maintenance -   Diet - Lacto/octovegetarin; Tofu/soy products Exercise - 3-4 days per week - walking  2) Preventative Exams / Immunizations:  Dental -- About a year and half ago Vision -- About 3 years Immunizations -- Completed flu shot; Will get TDap; PAP -- Up to date (July 2015 - normal)  Review of Systems  Constitutional: Denies fever, chills, fatigue, or significant weight gain/loss. HENT: Head: Denies headache or neck pain Ears: Denies changes in hearing, ringing in ears, earache, drainage Nose: Denies discharge, stuffiness, itching, nosebleed, sinus pain Throat: Denies sore throat, hoarseness, dry mouth, sores, thrush Sore throat at present Eyes: Denies loss/changes in vision, pain, redness, blurry/double vision, flashing lights Cardiovascular: Denies chest pain/discomfort, tightness, palpitations, shortness of breath with activity, difficulty lying down, swelling, sudden awakening with shortness of breath Respiratory: Denies shortness of breath, cough, sputum production, wheezing Gastrointestinal: Denies dysphasia, heartburn, change in appetite, nausea, change in bowel habits, rectal bleeding, constipation, diarrhea, yellow skin or eyes Genitourinary: Denies frequency, urgency, burning/pain, blood in urine, incontinence, change in urinary strength. Musculoskeletal: Denies muscle/joint pain, stiffness, back pain, redness or swelling of joints, trauma Skin: Denies rashes, lumps, itching, dryness, color changes, or hair/nail changes Neurological: Denies dizziness, fainting, seizures, weakness, numbness, tingling, tremor Psychiatric - Denies nervousness, stress, depression or memory loss Endocrine:  Denies heat or cold intolerance, sweating, frequent urination, excessive thirst, changes in appetite Hematologic: Denies ease of bruising or bleeding    Objective:    BP 128/82  Pulse 70  Temp(Src) 98.4 F (36.9 C) (Oral)  Resp 18  Ht 5\' 4"  (1.626 m)  Wt 180 lb 1.9 oz (81.702 kg)  BMI 30.90 kg/m2  SpO2 97% Waist 36 in  Hip 44.5 in  Neck 13 in  Nursing note and vital signs reviewed. Wa Physical Exam  Constitutional: She is oriented to person, place, and time. She appears well-developed and well-nourished. No distress.  HENT:  Head: Normocephalic.  Right Ear: Hearing, tympanic membrane, external ear and ear canal normal.  Left Ear: Hearing, tympanic membrane, external ear and ear canal normal.  Nose: Nose normal. Right sinus exhibits no maxillary sinus tenderness and no frontal sinus tenderness. Left sinus exhibits no maxillary sinus tenderness and no frontal sinus tenderness.  Mouth/Throat: Uvula is midline, oropharynx is clear and moist and mucous membranes are normal.  Eyes: Conjunctivae and EOM are normal. Pupils are equal, round, and reactive to light.  Neck: Neck supple. No JVD present. No tracheal deviation present. No thyromegaly present.  Cardiovascular: Normal rate, regular rhythm, normal heart sounds and intact distal pulses.   Pulmonary/Chest: Effort normal and breath sounds normal.  Abdominal: Soft. Bowel sounds are normal. She exhibits no distension and no mass. There is no tenderness. There is no rebound and no guarding.  Lymphadenopathy:    She has no cervical adenopathy.  Neurological: She is alert and oriented to person, place, and time. She has normal reflexes. No cranial nerve deficit. Coordination normal.  Skin: Skin is warm and dry.  Psychiatric: She has a normal mood and affect. Her behavior is normal. Judgment and thought content normal.       Assessment &  Plan:

## 2014-06-20 NOTE — Patient Instructions (Addendum)
Thank you for choosing [facility].  Summary/Instructions:   You have completed your wellness exam today.  Please get a copy of your lab results from [facility]   Follow up as needed.

## 2014-06-20 NOTE — Progress Notes (Signed)
Pre visit review using our clinic review tool, if applicable. No additional management support is needed unless otherwise documented below in the visit note. 

## 2014-06-20 NOTE — Assessment & Plan Note (Signed)
1) Anticipatory Guidance: Discussed importance of biannual dental exams and routine vision exams. Discussed safety including changing batteries in smoke alarms, wearing suntan lotion in the sun, wearing a seatbelt, and not texting and driving.   2) Risk Factor Reduction: Discussed reducing weight 5-10% at a time to improve overall health and reduce cardiovascular risk factor.  3) Immunizations / Screenings / Labs:  TDap given - all other immunizations up to date at present / PAP is up to date, overdue for dental and vision exams/ Labs completed at [facility] - copy requested.   Overall normal exam. Follow up in one year or sooner depending upon lab results.

## 2014-06-20 NOTE — Assessment & Plan Note (Signed)
Discussed continuing to eating a balanced diet of fruits, vegetable and protein. Increase exercise to 30 minutes most days of the week. Goal is to lose 5-10%.

## 2014-06-25 ENCOUNTER — Encounter: Payer: Self-pay | Admitting: Family

## 2014-07-23 ENCOUNTER — Ambulatory Visit (INDEPENDENT_AMBULATORY_CARE_PROVIDER_SITE_OTHER): Payer: PRIVATE HEALTH INSURANCE | Admitting: Family

## 2014-07-23 ENCOUNTER — Encounter: Payer: Self-pay | Admitting: Family

## 2014-07-23 VITALS — BP 122/78 | HR 62 | Temp 98.0°F | Resp 18 | Ht 64.0 in | Wt 183.4 lb

## 2014-07-23 DIAGNOSIS — E669 Obesity, unspecified: Secondary | ICD-10-CM

## 2014-07-23 MED ORDER — PHENTERMINE HCL 37.5 MG PO CAPS: 37.5000 mg | ORAL_CAPSULE | ORAL | Status: DC

## 2014-07-23 NOTE — Assessment & Plan Note (Signed)
Continues to struggle with weight loss over the past month with lifestyle changes alone. Start phentermine. Continue lifestyle changes of improving the nutrient density of diet and increasing activity to 30 minutes per day. Goal remains to lose about 5-10% of body weight (9-18 lbs). Discussed importance of behavior change along with medication use. Follow up in 1 month.

## 2014-07-23 NOTE — Progress Notes (Signed)
Pre visit review using our clinic review tool, if applicable. No additional management support is needed unless otherwise documented below in the visit note. 

## 2014-07-23 NOTE — Progress Notes (Signed)
   Subjective:    Patient ID: Alexandra BerkshireLaura J Osborn, female    DOB: 08-Aug-1986, 28 y.o.   MRN: [id number]  Chief Complaint  Patient presents with  . Follow-up    Wants to talk about weight loss medication    HPI:  Alexandra BerkshireLaura J Fesler is a 28 y.o. female who presents today to discuss weight loss medication.   Was previously seen for a physical and discussed weight loss. Since that time she has gain a little bit of weight. After research, she would like to try phentermine to kick start her weight loss. She is currently using nexplanon for birth control. Denies any history of chest pain/discomfort, shortness of breath, heart palpitations or incidence of high blood pressure, glaucoma, or hyperthyroidism.  Wt Readings from Last 3 Encounters:  07/23/14 183 lb 6.4 oz (83.19 kg)  06/20/14 180 lb 1.9 oz (81.702 kg)  09/19/12 170 lb (77.111 kg)   Allergies  Allergen Reactions  . Sulfa Antibiotics Rash   No current outpatient prescriptions on file prior to visit.   No current facility-administered medications on file prior to visit.    Review of Systems    See HPI.   Objective:    BP 122/78 mmHg  Pulse 62  Temp(Src) 98 F (36.7 C) (Oral)  Resp 18  Ht 5\' 4"  (1.626 m)  Wt 183 lb 6.4 oz (83.19 kg)  BMI 31.47 kg/m2  SpO2 98% Nursing note and vital signs reviewed.  Physical Exam  Constitutional: She is oriented to person, place, and time. She appears well-developed and well-nourished. No distress.  Pleasant obese female seated in the chair, dressed appropriately, and appears her stated age.   Neck: Neck supple. No thyromegaly present.  Cardiovascular: Normal rate, regular rhythm, normal heart sounds and intact distal pulses.  Exam reveals no gallop and no friction rub.   No murmur heard. Pulmonary/Chest: Effort normal and breath sounds normal.  Neurological: She is alert and oriented to person, place, and time.  Skin: Skin is warm and dry.  Psychiatric: She has a normal mood and affect.  Her behavior is normal. Judgment and thought content normal.       Assessment & Plan:

## 2014-07-23 NOTE — Patient Instructions (Signed)
Thank you for choosing [facility].  Summary/Instructions:  Your prescription(s) have been submitted to your pharmacy. Please take as directed and contact our office if you believe you are having problem(s) with the medication(s).   Phentermine tablets or capsules What is this medicine? PHENTERMINE (FEN ter meen) decreases your appetite. It is used with a reduced calorie diet and exercise to help you lose weight. This medicine may be used for other purposes; ask your health care provider or pharmacist if you have questions. COMMON BRAND NAME(S): Adipex-P, Atti-Plex P, Atti-Plex P Spansule, Fastin, Pro-Fast, Tara-8 What should I tell my health care provider before I take this medicine? They need to know if you have any of these conditions: -agitation -glaucoma -heart disease -high blood pressure -history of substance abuse -lung disease called Primary Pulmonary Hypertension (PPH) -taken an MAOI like Carbex, Eldepryl, Marplan, Nardil, or Parnate in last 14 days -thyroid disease -an unusual or allergic reaction to phentermine, other medicines, foods, dyes, or preservatives -pregnant or trying to get pregnant -breast-feeding How should I use this medicine? Take this medicine by mouth with a glass of water. Follow the directions on the prescription label. This medicine is usually taken 30 minutes before or 1 to 2 hours after breakfast. Avoid taking this medicine in the evening. It may interfere with sleep. Take your doses at regular intervals. Do not take your medicine more often than directed. Talk to your pediatrician regarding the use of this medicine in children. Special care may be needed. Overdosage: If you think you have taken too much of this medicine contact a poison control center or emergency room at once. NOTE: This medicine is only for you. Do not share this medicine with others. What if I miss a dose? If you miss a dose, take it as soon as you can. If it is almost time  for your next dose, take only that dose. Do not take double or extra doses. What may interact with this medicine? Do not take this medicine with any of the following medications: -duloxetine -MAOIs like Carbex, Eldepryl, Marplan, Nardil, and Parnate -medicines for colds or breathing difficulties like pseudoephedrine or phenylephrine -procarbazine -sibutramine -SSRIs like citalopram, escitalopram, fluoxetine, fluvoxamine, paroxetine, and sertraline -stimulants like dexmethylphenidate, methylphenidate or modafinil -venlafaxine This medicine may also interact with the following medications: -medicines for diabetes This list may not describe all possible interactions. Give your health care provider a list of all the medicines, herbs, non-prescription drugs, or dietary supplements you use. Also tell them if you smoke, drink alcohol, or use illegal drugs. Some items may interact with your medicine. What should I watch for while using this medicine? Notify your physician immediately if you become short of breath while doing your normal activities. Do not take this medicine within 6 hours of bedtime. It can keep you from getting to sleep. Avoid drinks that contain caffeine and try to stick to a regular bedtime every night. This medicine was intended to be used in addition to a healthy diet and exercise. The best results are achieved this way. This medicine is only indicated for short-term use. Eventually your weight loss may level out. At that point, the drug will only help you maintain your new weight. Do not increase or in any way change your dose without consulting your doctor. You may get drowsy or dizzy. Do not drive, use machinery, or do anything that needs mental alertness until you know how this medicine affects you. Do not stand or sit up quickly, especially  if you are an older patient. This reduces the risk of dizzy or fainting spells. Alcohol may increase dizziness and drowsiness. Avoid  alcoholic drinks. What side effects may I notice from receiving this medicine? Side effects that you should report to your doctor or health care professional as soon as possible: -chest pain, palpitations -depression or severe changes in mood -increased blood pressure -irritability -nervousness or restlessness -severe dizziness -shortness of breath -problems urinating -unusual swelling of the legs -vomiting Side effects that usually do not require medical attention (report to your doctor or health care professional if they continue or are bothersome): -blurred vision or other eye problems -changes in sexual ability or desire -constipation or diarrhea -difficulty sleeping -dry mouth or unpleasant taste -headache -nausea This list may not describe all possible side effects. Call your doctor for medical advice about side effects. You may report side effects to FDA at 1-800-FDA-1088. Where should I keep my medicine? Keep out of the reach of children. This medicine can be abused. Keep your medicine in a safe place to protect it from theft. Do not share this medicine with anyone. Selling or giving away this medicine is dangerous and against the law. Store at room temperature between 20 and 25 degrees C (68 and 77 degrees F). Keep container tightly closed. Throw away any unused medicine after the expiration date. NOTE: This sheet is a summary. It may not cover all possible information. If you have questions about this medicine, talk to your doctor, pharmacist, or health care provider.  2015, [employer]vier/Gold Standard. (2010-09-24 11:02:44)

## 2015-06-24 ENCOUNTER — Encounter: Payer: Self-pay | Admitting: Family

## 2015-06-24 ENCOUNTER — Ambulatory Visit (INDEPENDENT_AMBULATORY_CARE_PROVIDER_SITE_OTHER): Payer: PRIVATE HEALTH INSURANCE | Admitting: Family

## 2015-06-24 VITALS — BP 128/82 | HR 77 | Temp 98.4°F | Resp 18 | Ht 64.0 in | Wt 181.0 lb

## 2015-06-24 DIAGNOSIS — Z0000 Encounter for general adult medical examination without abnormal findings: Secondary | ICD-10-CM

## 2015-06-24 MED ORDER — PHENTERMINE HCL 37.5 MG PO CAPS: 37.5000 mg | ORAL_CAPSULE | ORAL | Status: DC

## 2015-06-24 NOTE — Progress Notes (Signed)
Pre visit review using our clinic review tool, if applicable. No additional management support is needed unless otherwise documented below in the visit note. 

## 2015-06-24 NOTE — Assessment & Plan Note (Signed)
1) Anticipatory Guidance: Discussed importance of wearing a seatbelt while driving and not texting while driving; changing batteries in smoke detector at least once annually; wearing suntan lotion when outside; eating a balanced and moderate diet; getting physical activity at least 30 minutes per day.  2) Immunizations / Screenings / Labs:  All immunizations are up to date per recommendations. Due for a dental exam which will be scheduled independently. All other screenings are up to date per recommendations. Obtain CBC, BMET, Lipid profile and TSH when fasting.  Overall well exam with minimal risk factors for cardiovascular disease with the exception of obesity. Already working on increasing physical activity and changing nutritional intake. Goal weight loss of about 5-10% of current weight. Counseled on continued physical activity and use of calorie tracking to assist with weight loss. Start phentermine to assist with weight loss. Continue other healthy lifestyle behaviors. Follow up prevention exam in 1 year. Follow up office visit in 1 month.

## 2015-06-24 NOTE — Progress Notes (Signed)
Subjective:    Patient ID: Alexandra Anderson, female    DOB: 08/15/1986, 29 y.o.   MRN: [id number]  Chief Complaint  Patient presents with  . CPE    Not fasting    HPI:  Alexandra Anderson is a 28 y.o. female who presents today for an annual wellness visit.   1) Health Maintenance -   Diet - Regular diet averaging about 3 meals per day consisting of eggs, fruits, vegetables, does not eat meat/seafood, some dairy; 4-5 cups of caffeine per day  Exercise - Working out in the the gym with a trainer 2x per week and is currently working boot camp 4x per week.    Wt Readings from Last 3 Encounters:  06/24/15 181 lb (82.101 kg)  07/23/14 183 lb 6.4 oz (83.19 kg)  06/20/14 180 lb 1.9 oz (81.702 kg)    2) Preventative Exams / Immunizations:  Dental -- Due for an exam  Vision -- Up to date   Health Maintenance  Topic Date Due  . INFLUENZA VACCINE  03/24/2016  . PAP SMEAR  02/27/2017  . TETANUS/TDAP  06/20/2024  . HIV Screening  Completed    Immunization History  Administered Date(s) Administered  . Influenza Split 05/25/2012  . Tdap 04/26/2012, 06/20/2014    Allergies  Allergen Reactions  . Sulfa Antibiotics Rash     Outpatient Prescriptions Prior to Visit  Medication Sig Dispense Refill  . phentermine 37.5 MG capsule Take 1 capsule (37.5 mg total) by mouth every morning. 30 capsule 0   No facility-administered medications prior to visit.     History reviewed. No pertinent past medical history.   Past Surgical History  Procedure Laterality Date  . Colposcopy    . Wisdom tooth extraction    . Open reduction internal fixation (orif) distal radial fracture  09/19/2012    Procedure: OPEN REDUCTION INTERNAL FIXATION (ORIF) DISTAL RADIAL FRACTURE;  Surgeon: Tami Ribas, MD;  Location: [facility];  Service: Orthopedics;  Laterality: Right;  open reduction internal fixation right distal radius fracture      Family History  Problem Relation Age of  Onset  . Other Neg Hx   . Heart disease Father      Social History   Social History  . Marital Status: Single    Spouse Name: N/A  . Number of Children: 1  . Years of Education: 16   Occupational History  . Credentialing Coordinator    Social History Main Topics  . Smoking status: Never Smoker   . Smokeless tobacco: Never Used  . Alcohol Use: Yes     Comment: occassionally  . Drug Use: No  . Sexual Activity: Yes    Birth Control/ Protection: Implant     Comment: Pt had implant put in  Jan.7th  at [facility]   Other Topics Concern  . Not on file   Social History Narrative   Fun: Crafts, be outside at the park   Feels safe and denies abuse     Review of Systems  Constitutional: Denies fever, chills, fatigue, or significant weight gain/loss. HENT: Head: Denies headache or neck pain Ears: Denies changes in hearing, ringing in ears, earache, drainage Nose: Denies discharge, stuffiness, itching, nosebleed, sinus pain Throat: Denies sore throat, hoarseness, dry mouth, sores, thrush Eyes: Denies loss/changes in vision, pain, redness, blurry/double vision, flashing lights Cardiovascular: Denies chest pain/discomfort, tightness, palpitations, shortness of breath with activity, difficulty lying down, swelling, sudden awakening with shortness of  breath Respiratory: Denies shortness of breath, cough, sputum production, wheezing Gastrointestinal: Denies dysphasia, heartburn, change in appetite, nausea, change in bowel habits, rectal bleeding, constipation, diarrhea, yellow skin or eyes Genitourinary: Denies frequency, urgency, burning/pain, blood in urine, incontinence, change in urinary strength. Musculoskeletal: Denies muscle/joint pain, stiffness, back pain, redness or swelling of joints, trauma Skin: Denies rashes, lumps, itching, dryness, color changes, or hair/nail changes Neurological: Denies dizziness, fainting, seizures, weakness, numbness, tingling,  tremor Psychiatric - Denies nervousness, stress, depression or memory loss Endocrine: Denies heat or cold intolerance, sweating, frequent urination, excessive thirst, changes in appetite Hematologic: Denies ease of bruising or bleeding     Objective:     BP 128/82 mmHg  Pulse 77  Temp(Src) 98.4 F (36.9 C) (Oral)  Resp 18  Ht 5\' 4"  (1.626 m)  Wt 181 lb (82.101 kg)  BMI 31.05 kg/m2  SpO2 97% Nursing note and vital signs reviewed.  Physical Exam  Constitutional: She is oriented to person, place, and time. She appears well-developed and well-nourished.  HENT:  Head: Normocephalic.  Right Ear: Hearing, tympanic membrane, external ear and ear canal normal.  Left Ear: Hearing, tympanic membrane, external ear and ear canal normal.  Nose: Nose normal.  Mouth/Throat: Uvula is midline, oropharynx is clear and moist and mucous membranes are normal.  Eyes: Conjunctivae and EOM are normal. Pupils are equal, round, and reactive to light.  Neck: Neck supple. No JVD present. No tracheal deviation present. No thyromegaly present.  Cardiovascular: Normal rate, regular rhythm, normal heart sounds and intact distal pulses.   Pulmonary/Chest: Effort normal and breath sounds normal.  Abdominal: Soft. Bowel sounds are normal. She exhibits no distension and no mass. There is no tenderness. There is no rebound and no guarding.  Musculoskeletal: Normal range of motion. She exhibits no edema or tenderness.  Lymphadenopathy:    She has no cervical adenopathy.  Neurological: She is alert and oriented to person, place, and time. She has normal reflexes. No cranial nerve deficit. She exhibits normal muscle tone. Coordination normal.  Skin: Skin is warm and dry.  Psychiatric: She has a normal mood and affect. Her behavior is normal. Judgment and thought content normal.       Assessment & Plan:   Problem List Items Addressed This Visit      Other   Routine general medical examination at a health care  facility - Primary    1) Anticipatory Guidance: Discussed importance of wearing a seatbelt while driving and not texting while driving; changing batteries in smoke detector at least once annually; wearing suntan lotion when outside; eating a balanced and moderate diet; getting physical activity at least 30 minutes per day.  2) Immunizations / Screenings / Labs:  All immunizations are up to date per recommendations. Due for a dental exam which will be scheduled independently. All other screenings are up to date per recommendations. Obtain CBC, BMET, Lipid profile and TSH when fasting.  Overall well exam with minimal risk factors for cardiovascular disease with the exception of obesity. Already working on increasing physical activity and changing nutritional intake. Goal weight loss of about 5-10% of current weight. Counseled on continued physical activity and use of calorie tracking to assist with weight loss. Start phentermine to assist with weight loss. Continue other healthy lifestyle behaviors. Follow up prevention exam in 1 year. Follow up office visit in 1 month.         Relevant Orders   CBC   Comprehensive metabolic panel   Lipid panel  TSH

## 2015-06-24 NOTE — Patient Instructions (Signed)
Thank you for choosing [facility].  Summary/Instructions:  Your prescription(s) have been submitted to your pharmacy or been printed and provided for you. Please take as directed and contact our office if you believe you are having problem(s) with the medication(s) or have any questions.  Please stop by the lab on the basement level of the building for your blood work. Your results will be released to MyChart (or called to you) after review, usually within 72 hours after test completion. If any changes need to be made, you will be notified at that same time.  If your symptoms worsen or fail to improve, please contact our office for further instruction, or in case of emergency go directly to the emergency room at the closest medical facility.   Health Maintenance, Female Adopting a healthy lifestyle and getting preventive care can go a long way to promote health and wellness. Talk with your health care provider about what schedule of regular examinations is right for you. This is a good chance for you to check in with your provider about disease prevention and staying healthy. In between checkups, there are plenty of things you can do on your own. Experts have done a lot of research about which lifestyle changes and preventive measures are most likely to keep you healthy. Ask your health care provider for more information. WEIGHT AND DIET  Eat a healthy diet  Be sure to include plenty of vegetables, fruits, low-fat dairy products, and lean protein.  Do not eat a lot of foods high in solid fats, added sugars, or salt.  Get regular exercise. This is one of the most important things you can do for your health.  Most adults should exercise for at least 150 minutes each week. The exercise should increase your heart rate and make you sweat (moderate-intensity exercise).  Most adults should also do strengthening exercises at least twice a week. This is in addition to the moderate-intensity  exercise.  Maintain a healthy weight  Body mass index (BMI) is a measurement that can be used to identify possible weight problems. It estimates body fat based on height and weight. Your health care provider can help determine your BMI and help you achieve or maintain a healthy weight.  For females 20 years of age and older:   A BMI below 18.5 is considered underweight.  A BMI of 18.5 to 24.9 is normal.  A BMI of 25 to 29.9 is considered overweight.  A BMI of 30 and above is considered obese.  Watch levels of cholesterol and blood lipids  You should start having your blood tested for lipids and cholesterol at 28 years of age, then have this test every 5 years.  You may need to have your cholesterol levels checked more often if:  Your lipid or cholesterol levels are high.  You are older than 28 years of age.  You are at high risk for heart disease.  CANCER SCREENING   Lung Cancer  Lung cancer screening is recommended for adults 55-80 years old who are at high risk for lung cancer because of a history of smoking.  A yearly low-dose CT scan of the lungs is recommended for people who:  Currently smoke.  Have quit within the past 15 years.  Have at least a 30-pack-year history of smoking. A pack year is smoking an average of one pack of cigarettes a day for 1 year.  Yearly screening should continue until it has been 15 years since you quit.    Yearly screening should stop if you develop a health problem that would prevent you from having lung cancer treatment.  Breast Cancer  Practice breast self-awareness. This means understanding how your breasts normally appear and feel.  It also means doing regular breast self-exams. Let your health care provider know about any changes, no matter how small.  If you are in your 20s or 30s, you should have a clinical breast exam (CBE) by a health care provider every 1-3 years as part of a regular health exam.  If you are 33 or  older, have a CBE every year. Also consider having a breast X-ray (mammogram) every year.  If you have a family history of breast cancer, talk to your health care provider about genetic screening.  If you are at high risk for breast cancer, talk to your health care provider about having an MRI and a mammogram every year.  Breast cancer gene (BRCA) assessment is recommended for women who have family members with BRCA-related cancers. BRCA-related cancers include:  Breast.  Ovarian.  Tubal.  Peritoneal cancers.  Results of the assessment will determine the need for genetic counseling and BRCA1 and BRCA2 testing. Cervical Cancer Your health care provider may recommend that you be screened regularly for cancer of the pelvic organs (ovaries, uterus, and vagina). This screening involves a pelvic examination, including checking for microscopic changes to the surface of your cervix (Pap test). You may be encouraged to have this screening done every 3 years, beginning at age 33.  For women ages 40-65, health care providers may recommend pelvic exams and Pap testing every 3 years, or they may recommend the Pap and pelvic exam, combined with testing for human papilloma virus (HPV), every 5 years. Some types of HPV increase your risk of cervical cancer. Testing for HPV may also be done on women of any age with unclear Pap test results.  Other health care providers may not recommend any screening for nonpregnant women who are considered low risk for pelvic cancer and who do not have symptoms. Ask your health care provider if a screening pelvic exam is right for you.  If you have had past treatment for cervical cancer or a condition that could lead to cancer, you need Pap tests and screening for cancer for at least 20 years after your treatment. If Pap tests have been discontinued, your risk factors (such as having a new sexual partner) need to be reassessed to determine if screening should resume. Some  women have medical problems that increase the chance of getting cervical cancer. In these cases, your health care provider may recommend more frequent screening and Pap tests. Colorectal Cancer  This type of cancer can be detected and often prevented.  Routine colorectal cancer screening usually begins at 28 years of age and continues through 28 years of age.  Your health care provider may recommend screening at an earlier age if you have risk factors for colon cancer.  Your health care provider may also recommend using home test kits to check for hidden blood in the stool.  A small camera at the end of a tube can be used to examine your colon directly (sigmoidoscopy or colonoscopy). This is done to check for the earliest forms of colorectal cancer.  Routine screening usually begins at age 15.  Direct examination of the colon should be repeated every 5-10 years through 28 years of age. However, you may need to be screened more often if early forms of precancerous polyps or  small growths are found. Skin Cancer  Check your skin from head to toe regularly.  Tell your health care provider about any new moles or changes in moles, especially if there is a change in a mole's shape or color.  Also tell your health care provider if you have a mole that is larger than the size of a pencil eraser.  Always use sunscreen. Apply sunscreen liberally and repeatedly throughout the day.  Protect yourself by wearing long sleeves, pants, a wide-brimmed hat, and sunglasses whenever you are outside. HEART DISEASE, DIABETES, AND HIGH BLOOD PRESSURE   High blood pressure causes heart disease and increases the risk of stroke. High blood pressure is more likely to develop in:  People who have blood pressure in the high end of the normal range (130-139/85-89 mm Hg).  People who are overweight or obese.  People who are African American.  If you are 18-39 years of age, have your blood pressure checked every  3-5 years. If you are 40 years of age or older, have your blood pressure checked every year. You should have your blood pressure measured twice--once when you are at a hospital or clinic, and once when you are not at a hospital or clinic. Record the average of the two measurements. To check your blood pressure when you are not at a hospital or clinic, you can use:  An automated blood pressure machine at a pharmacy.  A home blood pressure monitor.  If you are between 55 years and 79 years old, ask your health care provider if you should take aspirin to prevent strokes.  Have regular diabetes screenings. This involves taking a blood sample to check your fasting blood sugar level.  If you are at a normal weight and have a low risk for diabetes, have this test once every three years after 28 years of age.  If you are overweight and have a high risk for diabetes, consider being tested at a younger age or more often. PREVENTING INFECTION  Hepatitis B  If you have a higher risk for hepatitis B, you should be screened for this virus. You are considered at high risk for hepatitis B if:  You were born in a country where hepatitis B is common. Ask your health care provider which countries are considered high risk.  Your parents were born in a high-risk country, and you have not been immunized against hepatitis B (hepatitis B vaccine).  You have HIV or AIDS.  You use needles to inject street drugs.  You live with someone who has hepatitis B.  You have had sex with someone who has hepatitis B.  You get hemodialysis treatment.  You take certain medicines for conditions, including cancer, organ transplantation, and autoimmune conditions. Hepatitis C  Blood testing is recommended for:  Everyone born from 1945 through 1965.  Anyone with known risk factors for hepatitis C. Sexually transmitted infections (STIs)  You should be screened for sexually transmitted infections (STIs) including  gonorrhea and chlamydia if:  You are sexually active and are younger than 28 years of age.  You are older than 28 years of age and your health care provider tells you that you are at risk for this type of infection.  Your sexual activity has changed since you were last screened and you are at an increased risk for chlamydia or gonorrhea. Ask your health care provider if you are at risk.  If you do not have HIV, but are at risk, it may be   recommended that you take a prescription medicine daily to prevent HIV infection. This is called pre-exposure prophylaxis (PrEP). You are considered at risk if:  You are sexually active and do not regularly use condoms or know the HIV status of your partner(s).  You take drugs by injection.  You are sexually active with a partner who has HIV. Talk with your health care provider about whether you are at high risk of being infected with HIV. If you choose to begin PrEP, you should first be tested for HIV. You should then be tested every 3 months for as long as you are taking PrEP.  PREGNANCY   If you are premenopausal and you may become pregnant, ask your health care provider about preconception counseling.  If you may become pregnant, take 400 to 800 micrograms (mcg) of folic acid every day.  If you want to prevent pregnancy, talk to your health care provider about birth control (contraception). OSTEOPOROSIS AND MENOPAUSE   Osteoporosis is a disease in which the bones lose minerals and strength with aging. This can result in serious bone fractures. Your risk for osteoporosis can be identified using a bone density scan.  If you are 65 years of age or older, or if you are at risk for osteoporosis and fractures, ask your health care provider if you should be screened.  Ask your health care provider whether you should take a calcium or vitamin D supplement to lower your risk for osteoporosis.  Menopause may have certain physical symptoms and  risks.  Hormone replacement therapy may reduce some of these symptoms and risks. Talk to your health care provider about whether hormone replacement therapy is right for you.  HOME CARE INSTRUCTIONS   Schedule regular health, dental, and eye exams.  Stay current with your immunizations.   Do not use any tobacco products including cigarettes, chewing tobacco, or electronic cigarettes.  If you are pregnant, do not drink alcohol.  If you are breastfeeding, limit how much and how often you drink alcohol.  Limit alcohol intake to no more than 1 drink per day for nonpregnant women. One drink equals 12 ounces of beer, 5 ounces of wine, or 1 ounces of hard liquor.  Do not use street drugs.  Do not share needles.  Ask your health care provider for help if you need support or information about quitting drugs.  Tell your health care provider if you often feel depressed.  Tell your health care provider if you have ever been abused or do not feel safe at home.   This information is not intended to replace advice given to you by your health care provider. Make sure you discuss any questions you have with your health care provider.   Document Released: 02/23/2011 Document Revised: 08/31/2014 Document Reviewed: 07/12/2013 Elsevier Interactive Patient Education 2016 [employer]   

## 2015-06-27 ENCOUNTER — Other Ambulatory Visit (INDEPENDENT_AMBULATORY_CARE_PROVIDER_SITE_OTHER): Payer: PRIVATE HEALTH INSURANCE

## 2015-06-27 ENCOUNTER — Encounter: Payer: Self-pay | Admitting: Family

## 2015-06-27 DIAGNOSIS — Z0000 Encounter for general adult medical examination without abnormal findings: Secondary | ICD-10-CM | POA: Diagnosis not present

## 2015-06-27 LAB — LIPID PANEL
Cholesterol: 168 mg/dL (ref 0–200)
HDL: 57 mg/dL (ref >39.00)
LDL Cholesterol: 94 mg/dL (ref 0–99)
NONHDL: 110.93
Total CHOL/HDL Ratio: 3
Triglycerides: 86 mg/dL (ref 0.0–149.0)
VLDL: 17.2 mg/dL (ref 0.0–40.0)

## 2015-06-27 LAB — COMPREHENSIVE METABOLIC PANEL
ALK PHOS: 46 U/L (ref 39–117)
ALT: 14 U/L (ref 0–35)
AST: 16 U/L (ref 0–37)
Albumin: 4.6 g/dL (ref 3.5–5.2)
BILIRUBIN TOTAL: 1 mg/dL (ref 0.2–1.2)
BUN: 11 mg/dL (ref 6–23)
CO2: 24 mEq/L (ref 19–32)
CREATININE: 0.77 mg/dL (ref 0.40–1.20)
Calcium: 9.6 mg/dL (ref 8.4–10.5)
Chloride: 104 mEq/L (ref 96–112)
GFR: 94.61 mL/min (ref >60.00)
GLUCOSE: 88 mg/dL (ref 70–99)
Potassium: 3.8 mEq/L (ref 3.5–5.1)
SODIUM: 137 meq/L (ref 135–145)
TOTAL PROTEIN: 7.7 g/dL (ref 6.0–8.3)

## 2015-06-27 LAB — CBC
HCT: 41.2 % (ref 36.0–46.0)
HEMOGLOBIN: 14 g/dL (ref 12.0–15.0)
MCHC: 34.1 g/dL (ref 30.0–36.0)
MCV: 90.1 fl (ref 78.0–100.0)
Platelets: 264 10*3/uL (ref 150.0–400.0)
RBC: 4.57 Mil/uL (ref 3.87–5.11)
RDW: 12.6 % (ref 11.5–15.5)
WBC: 8.5 10*3/uL (ref 4.0–10.5)

## 2015-06-27 LAB — TSH: TSH: 1.02 u[IU]/mL (ref 0.35–4.50)

## 2015-08-07 ENCOUNTER — Encounter: Payer: Self-pay | Admitting: Family

## 2015-08-07 ENCOUNTER — Ambulatory Visit (INDEPENDENT_AMBULATORY_CARE_PROVIDER_SITE_OTHER): Payer: PRIVATE HEALTH INSURANCE | Admitting: Family

## 2015-08-07 VITALS — BP 112/82 | HR 83 | Temp 97.7°F | Resp 18 | Ht 64.0 in | Wt 174.0 lb

## 2015-08-07 DIAGNOSIS — E663 Overweight: Secondary | ICD-10-CM | POA: Diagnosis not present

## 2015-08-07 MED ORDER — PHENTERMINE HCL 37.5 MG PO CAPS: 37.5000 mg | ORAL_CAPSULE | ORAL | Status: DC

## 2015-08-07 NOTE — Assessment & Plan Note (Addendum)
Total weight loss of 9 pounds or approximately 5% of previous body weight since initiation of therapy. Continues to exercise and work on nutrition. BMI decreased from obesity to overweight. Denies adverse side effects of medication. Refill phentermine. Follow up for next refill.

## 2015-08-07 NOTE — Progress Notes (Signed)
Pre visit review using our clinic review tool, if applicable. No additional management support is needed unless otherwise documented below in the visit note. 

## 2015-08-07 NOTE — Progress Notes (Signed)
   Subjective:    Patient ID: Alexandra Anderson, female    DOB: 06-30-1986, 29 y.o.   MRN: [id number]  Chief Complaint  Patient presents with  . Follow-up    refill of phentermine    HPI:  Alexandra Anderson is a 29 y.o. female who  has no past medical history on file. and presents today for an office follow up.   Obesity - currently maintained on phentermine to assist with weight loss. Does not take the medication regularly and denies adverse side effects. She is working on her physical activity and has obtained a pedometer which helps her reach her goal of 10,000 steps daily. Nutritionally eating about 3 meals per day and eating eggs, snack, and fruits and vegetables. She has lost a total of 10 pounds. Denies any cardiac symptoms.   Wt Readings from Last 3 Encounters:  08/07/15 174 lb (78.926 kg)  06/24/15 181 lb (82.101 kg)  07/23/14 183 lb 6.4 oz (83.19 kg)   Allergies  Allergen Reactions  . Sulfa Antibiotics Rash    Outpatient Prescriptions Prior to Visit  Medication Sig Dispense Refill  . phentermine 37.5 MG capsule Take 1 capsule (37.5 mg total) by mouth every morning. 30 capsule 0   No facility-administered medications prior to visit.    Review of Systems  Eyes:       Negative for changes in vision.  Respiratory: Negative for chest tightness and shortness of breath.   Cardiovascular: Negative for chest pain, palpitations and leg swelling.  Neurological: Negative for headaches.      Objective:    BP 112/82 mmHg  Pulse 83  Temp(Src) 97.7 F (36.5 C) (Oral)  Resp 18  Ht 5\' 4"  (1.626 m)  Wt 174 lb (78.926 kg)  BMI 29.85 kg/m2  SpO2 97% Nursing note and vital signs reviewed.  BP Readings from Last 3 Encounters:  08/07/15 112/82  06/24/15 128/82  07/23/14 122/78    Physical Exam  Constitutional: She is oriented to person, place, and time. She appears well-developed and well-nourished. No distress.  Cardiovascular: Normal rate, regular rhythm, normal heart  sounds and intact distal pulses.   Pulmonary/Chest: Effort normal and breath sounds normal.  Neurological: She is alert and oriented to person, place, and time.  Skin: Skin is warm and dry.  Psychiatric: She has a normal mood and affect. Her behavior is normal. Judgment and thought content normal.       Assessment & Plan:   Problem List Items Addressed This Visit      Other   Overweight (BMI 25.0-29.9) - Primary    Total weight loss of 9 pounds or approximately 5% of previous body weight since initiation of therapy. Continues to exercise and work on nutrition. BMI decreased from obesity to overweight. Denies adverse side effects of medication. Refill phentermine. Follow up for next refill.      Relevant Medications   phentermine 37.5 MG capsule

## 2015-08-07 NOTE — Patient Instructions (Signed)
Thank you for choosing [facility].  Summary/Instructions:  Your prescription(s) have been submitted to your pharmacy or been printed and provided for you. Please take as directed and contact our office if you believe you are having problem(s) with the medication(s) or have any questions.  If your symptoms worsen or fail to improve, please contact our office for further instruction, or in case of emergency go directly to the emergency room at the closest medical facility.     

## 2017-05-26 ENCOUNTER — Ambulatory Visit (INDEPENDENT_AMBULATORY_CARE_PROVIDER_SITE_OTHER): Payer: PRIVATE HEALTH INSURANCE | Admitting: Family Medicine

## 2017-05-26 ENCOUNTER — Encounter: Payer: Self-pay | Admitting: Family Medicine

## 2017-05-26 VITALS — BP 108/70 | HR 58 | Temp 98.4°F | Resp 16 | Ht 64.0 in | Wt 180.0 lb

## 2017-05-26 DIAGNOSIS — Z23 Encounter for immunization: Secondary | ICD-10-CM | POA: Diagnosis not present

## 2017-05-26 DIAGNOSIS — R0782 Intercostal pain: Secondary | ICD-10-CM | POA: Insufficient documentation

## 2017-05-26 NOTE — Progress Notes (Signed)
Alexandra Anderson - 31 y.o. female MRN [id number]  Date of birth: 1986/05/19  SUBJECTIVE:  Including CC & ROS.  Chief Complaint  Patient presents with  . Chest Pain    in july started having heartburn, headaches, and back ache, started a new diet in august and sxs subsided, has been running more and has been feeling pains in chest    Alexandra Anderson is a 31 year old female that is presenting with left-sided chest pain. This is occurring between the second and third ribs at the mid clavicular line. She denies any trauma to this area. This is intermittent in nature and lasts for 2-3 minutes. It is sharp and stabbing in nature. Does not seem to be associated with any specific movement or breathing. Has been occurring for a few weeks. She denies any worsening of her symptoms. Has not tried any medications. She has started running and exercising more frequently than she normally does. Denies any underlying breathing disorders. No history of blood clots. No association with diaphoresis or nausea. There is no radiation to her arms.     Review of Systems  Constitutional: Negative for fever.  Respiratory: Negative for cough and shortness of breath.   Cardiovascular: Positive for chest pain.  Musculoskeletal: Negative for gait problem.    HISTORY: Past Medical, Surgical, Social, and Family History Reviewed & Updated per EMR.   Pertinent Historical Findings include:  Past Medical History:  Diagnosis Date  . Migraines     Past Surgical History:  Procedure Laterality Date  . COLPOSCOPY    . OPEN REDUCTION INTERNAL FIXATION (ORIF) DISTAL RADIAL FRACTURE  09/19/2012   Procedure: OPEN REDUCTION INTERNAL FIXATION (ORIF) DISTAL RADIAL FRACTURE;  Surgeon: Tami Ribas, MD;  Location: [facility];  Service: Orthopedics;  Laterality: Right;  open reduction internal fixation right distal radius fracture   . WISDOM TOOTH EXTRACTION      Allergies  Allergen Reactions  . Sulfa Antibiotics Rash     Family History  Problem Relation Age of Onset  . Heart disease Father   . Other Neg Hx      Social History   Social History  . Marital status: Single    Spouse name: N/A  . Number of children: 1  . Years of education: 23   Occupational History  . Credentialing Coordinator    Social History Main Topics  . Smoking status: Never Smoker  . Smokeless tobacco: Never Used  . Alcohol use Yes     Comment: occassionally  . Drug use: No  . Sexual activity: Yes    Birth control/ protection: Implant     Comment: Pt had implant put in  Jan.7th  at [facility]   Other Topics Concern  . Not on file   Social History Narrative   Fun: Crafts, be outside at the park   Feels safe and denies abuse     PHYSICAL EXAM:  VS: BP 108/70 (BP Location: Left Arm, Patient Position: Sitting, Cuff Size: Large)   Pulse (!) 58   Temp 98.4 F (36.9 C) (Oral)   Resp 16   Ht  (1.626 m)   Wt 180 lb (81.6 kg)   SpO2 98%   BMI 30.90 kg/m  Physical Exam Gen: NAD, alert, cooperative with exam, well-appearing ENT: normal lips, normal nasal mucosa,  Eye: normal EOM, normal conjunctiva and lids CV:  no edema, +2 pedal pulses, S1-S2,    Resp: no accessory muscle use, non-labored, clear to auscultation  bilaterally, no crackles or wheezes Skin: no rashes, no areas of induration  Neuro: normal tone, normal sensation to touch Psych:  normal insight, alert and oriented MSK:  Chest: No tenderness to palpation of the sternoclavicular joint or the acromioclavicular joint. No tenderness to palpation of the sternum. No tenderness to palpation over the intercostals of the second and third rib. No overlying swelling or ecchymosis. No step-offs palpated. Neurovascularly intact      ASSESSMENT & PLAN:   Intercostal pain Pain is likely muscular in nature. Does not seem to be associated with any underlying lung pathology. Does not seem to be cardiac in nature. Has not had any recent  illnesses. - Reassurance provided - Can take anti-inflammatories as needed - If no improvement can consider a x-ray or referral to physical therapy and follow-up.

## 2017-05-26 NOTE — Patient Instructions (Signed)
Thank you for coming in,   Please follow up with me if there is no improvement.   Please try ibuprofen or tylenol for the pain.    Please feel free to call with any questions or concerns at any time, at [phone]. --Dr. Jordan Likes

## 2017-05-26 NOTE — Assessment & Plan Note (Signed)
Pain is likely muscular in nature. Does not seem to be associated with any underlying lung pathology. Does not seem to be cardiac in nature. Has not had any recent illnesses. - Reassurance provided - Can take anti-inflammatories as needed - If no improvement can consider a x-ray or referral to physical therapy and follow-up.

## 2017-09-08 ENCOUNTER — Encounter: Payer: Self-pay | Admitting: Family Medicine

## 2018-01-28 ENCOUNTER — Encounter: Payer: Self-pay | Admitting: Family Medicine

## 2018-01-28 ENCOUNTER — Ambulatory Visit (INDEPENDENT_AMBULATORY_CARE_PROVIDER_SITE_OTHER): Payer: PRIVATE HEALTH INSURANCE | Admitting: Family Medicine

## 2018-01-28 VITALS — BP 132/80 | HR 98 | Temp 97.9°F | Ht 64.0 in | Wt 169.0 lb

## 2018-01-28 DIAGNOSIS — R21 Rash and other nonspecific skin eruption: Secondary | ICD-10-CM | POA: Diagnosis not present

## 2018-01-28 DIAGNOSIS — X58XXXA Exposure to other specified factors, initial encounter: Secondary | ICD-10-CM

## 2018-01-28 MED ORDER — CEPHALEXIN 500 MG PO CAPS: 500.0000 mg | ORAL_CAPSULE | Freq: Three times a day (TID) | ORAL | 0 refills | Status: AC

## 2018-01-28 MED ORDER — TRIAMCINOLONE ACETONIDE 0.1 % EX CREA: 1.0000 "application " | TOPICAL_CREAM | Freq: Two times a day (BID) | CUTANEOUS | 0 refills | Status: AC

## 2018-01-28 NOTE — Addendum Note (Signed)
Addended by: Mare LoanLAWSON, Merve Hotard M on: 01/28/2018 10:47 AM   Modules accepted: Orders

## 2018-01-28 NOTE — Progress Notes (Addendum)
Patient: Alexandra BerkshireLaura J Oyen MRN: [id number] DOB: [birth year] PCP: Veryl Speakalone, Gregory D, FNP     Subjective:  Chief Complaint  Patient presents with  . Rash    HPI: The patient is a 32 y.o. female who presents today for skin injury due to coral reef injury. She was snorkeling around may 20th and had to slide down some rock on her bottom. It was coral and she had immediate burning on the back of her legs. She thought it was getting better, but one week ago it started to get really itchy. It is raised and erythematous. NO deep puncture wounds. She is up to date on her tetanus.   Review of Systems  Constitutional: Negative for chills, fatigue and fever.  Respiratory: Negative for shortness of breath.   Cardiovascular: Negative for chest pain.  Gastrointestinal: Negative for diarrhea.  Skin: Positive for rash.  Neurological: Negative for facial asymmetry, weakness, light-headedness and headaches.    Allergies Patient is allergic to sulfa antibiotics.  Past Medical History Patient  has a past medical history of Migraines.  Surgical History Patient  has a past surgical history that includes Colposcopy; Wisdom tooth extraction; and Open reduction internal fixation (orif) distal radial fracture (09/19/2012).  Family History Pateint's family history includes Heart disease in her father.  Social History Patient  reports that she has never smoked. She has never used smokeless tobacco. She reports that she drinks alcohol. She reports that she does not use drugs.    Objective: Vitals:   01/28/18 0957  BP: 132/80  Pulse: 98  Temp: 97.9 F (36.6 C)  SpO2: 99%  Weight: 169 lb (76.7 kg)  Height: 5\' 4"  (1.626 m)    Body mass index is 29.01 kg/m.  Physical Exam  Constitutional: She appears well-developed and well-nourished.  Skin: Rash noted.  She has raised erythematous rash on bilateral dorsal aspect of her upper legs. Not warm to touch. No deep cuts or lesions. Coalescing areas through  out rash.   Vitals reviewed.      Assessment/plan: 1. Rash Secondary to coral sting. Looks good and no deep punctures to cause concern for vibrio infection. Topical steroid for itch control and course of keflex. Let me know if not getting better.    Return if symptoms worsen or fail to improve.    Orland MustardAllison Wolfe, MD [facility]   01/28/2018
